# Patient Record
Sex: Female | Born: 1965 | Race: White | Hispanic: No | Marital: Married | Smoking: Former smoker
Health system: Southern US, Community
[De-identification: ages and names within clinical notes are randomized; demographics above are authoritative.]

## PROBLEM LIST (undated history)

## (undated) DIAGNOSIS — Z973 Presence of spectacles and contact lenses: Secondary | ICD-10-CM

## (undated) DIAGNOSIS — Z960 Presence of urogenital implants: Secondary | ICD-10-CM

## (undated) DIAGNOSIS — I059 Rheumatic mitral valve disease, unspecified: Secondary | ICD-10-CM

## (undated) DIAGNOSIS — D8989 Other specified disorders involving the immune mechanism, not elsewhere classified: Secondary | ICD-10-CM

## (undated) DIAGNOSIS — H3500 Unspecified background retinopathy: Secondary | ICD-10-CM

## (undated) DIAGNOSIS — R42 Dizziness and giddiness: Secondary | ICD-10-CM

## (undated) DIAGNOSIS — N879 Dysplasia of cervix uteri, unspecified: Secondary | ICD-10-CM

## (undated) DIAGNOSIS — M722 Plantar fascial fibromatosis: Secondary | ICD-10-CM

## (undated) HISTORY — DX: Plantar fascial fibromatosis: M72.2

## (undated) HISTORY — DX: Dysplasia of cervix uteri, unspecified: N87.9

## (undated) HISTORY — DX: Rheumatic mitral valve disease, unspecified: I05.9

## (undated) HISTORY — DX: Unspecified background retinopathy: H35.00

## (undated) HISTORY — PX: OTHER SURGICAL HISTORY: SHX169

## (undated) HISTORY — DX: Other specified disorders involving the immune mechanism, not elsewhere classified: D89.89

## (undated) HISTORY — PX: TONSILLECTOMY AND ADENOIDECTOMY: SUR1326

---

## 1998-04-23 DIAGNOSIS — N879 Dysplasia of cervix uteri, unspecified: Secondary | ICD-10-CM

## 1998-04-23 HISTORY — DX: Dysplasia of cervix uteri, unspecified: N87.9

## 1999-04-24 HISTORY — PX: OTHER SURGICAL HISTORY: SHX169

## 2003-04-24 HISTORY — PX: TUBAL LIGATION: SHX77

## 2006-06-14 ENCOUNTER — Ambulatory Visit: Payer: Self-pay | Admitting: Obstetrics and Gynecology

## 2006-10-04 ENCOUNTER — Ambulatory Visit: Payer: Self-pay

## 2007-08-24 ENCOUNTER — Emergency Department: Payer: Self-pay | Admitting: Emergency Medicine

## 2007-10-06 ENCOUNTER — Ambulatory Visit: Payer: Self-pay

## 2008-10-07 ENCOUNTER — Ambulatory Visit: Payer: Self-pay

## 2009-11-03 ENCOUNTER — Ambulatory Visit: Payer: Self-pay

## 2010-11-15 ENCOUNTER — Ambulatory Visit: Payer: Self-pay

## 2011-11-20 ENCOUNTER — Ambulatory Visit: Payer: Self-pay

## 2012-11-20 ENCOUNTER — Ambulatory Visit: Payer: Self-pay

## 2013-01-09 ENCOUNTER — Ambulatory Visit: Payer: Self-pay | Admitting: Internal Medicine

## 2013-11-25 ENCOUNTER — Ambulatory Visit: Payer: Self-pay

## 2014-04-02 ENCOUNTER — Ambulatory Visit: Payer: Self-pay | Admitting: Ophthalmology

## 2015-06-03 ENCOUNTER — Ambulatory Visit
Admission: EM | Admit: 2015-06-03 | Discharge: 2015-06-03 | Disposition: A | Discharge status: Home / Self Care | Payer: BC Managed Care – PPO | Attending: Family Medicine | Admitting: Family Medicine

## 2015-06-03 DIAGNOSIS — J029 Acute pharyngitis, unspecified: Secondary | ICD-10-CM | POA: Diagnosis not present

## 2015-06-03 DIAGNOSIS — K120 Recurrent oral aphthae: Secondary | ICD-10-CM

## 2015-06-03 LAB — RAPID STREP SCREEN (MED CTR MEBANE ONLY): Streptococcus, Group A Screen (Direct): NEGATIVE

## 2015-06-03 NOTE — ED Notes (Signed)
Patient c/o sore throat and what she calls the "start of an ear infection."  Sore throat started last Friday and ear discomfort started today.  Denies fever/c/n/v.

## 2015-06-03 NOTE — ED Provider Notes (Signed)
CSN: [id number]     Arrival date & time 06/03/15  1745 History   First MD Initiated Contact with Patient 06/03/15 1914     Chief Complaint  Patient presents with  . Sore Throat   (Consider location/radiation/quality/duration/timing/severity/associated sxs/prior Treatment) Patient is a 50 y.o. female presenting with pharyngitis. The history is provided by the patient.  Sore Throat This is a new problem. The current episode started more than 2 days ago. The problem occurs constantly. The problem has not changed since onset.Pertinent negatives include no shortness of breath.    History reviewed. No pertinent past medical history. Past Surgical History  Procedure Laterality Date  . Tubal ligation    . Tonsillectomy    . Addenoids removed    . Tubes in ears     History reviewed. No pertinent family history. Social History  Substance Use Topics  . Smoking status: Never Smoker   . Smokeless tobacco: Never Used  . Alcohol Use: No   OB History    No data available     Review of Systems  Respiratory: Negative for shortness of breath.     Allergies  Amoxicillin; Etodolac; and Sulfa antibiotics  Home Medications   Prior to Admission medications   Medication Sig Start Date End Date Taking? Authorizing Provider  Calcium Acetate, Phos Binder, (CALCIUM ACETATE PO) Take by mouth.   Yes Historical Provider, MD  GARLIC PO Take by mouth.   Yes Historical Provider, MD  Multiple Vitamin (MULTIVITAMIN) capsule Take 1 capsule by mouth daily.   Yes Historical Provider, MD  Omega-3 Fatty Acids (FISH OIL PO) Take 1 tablet by mouth daily.   Yes Historical Provider, MD   Meds Ordered and Administered this Visit  Medications - No data to display  BP 114/73 mmHg  Pulse 64  Temp(Src) 98.6 F (37 C) (Oral)  Resp 16  Ht  (1.6 m)  Wt 130 lb (58.968 kg)  BMI 23.03 kg/m2  SpO2 100%  LMP 05/20/2015 No data found.   Physical Exam  Constitutional: She appears well-developed and  well-nourished. No distress.  HENT:  Head: Normocephalic and atraumatic.  Right Ear: Tympanic membrane, external ear and ear canal normal.  Left Ear: Tympanic membrane, external ear and ear canal normal.  Nose: Mucosal edema and rhinorrhea present. No nose lacerations, sinus tenderness, nasal deformity, septal deviation or nasal septal hematoma. No epistaxis.  No foreign bodies. Right sinus exhibits no maxillary sinus tenderness and no frontal sinus tenderness. Left sinus exhibits no maxillary sinus tenderness and no frontal sinus tenderness.  Mouth/Throat: Uvula is midline, oropharynx is clear and moist and mucous membranes are normal. Oral lesions (left soft palate superficial apthous ulcer) present. No oropharyngeal exudate.    Eyes: Conjunctivae and EOM are normal. Pupils are equal, round, and reactive to light. Right eye exhibits no discharge. Left eye exhibits no discharge. No scleral icterus.  Neck: Normal range of motion. Neck supple. No thyromegaly present.  Cardiovascular: Normal rate, regular rhythm and normal heart sounds.   Pulmonary/Chest: Effort normal and breath sounds normal. No respiratory distress. She has no wheezes. She has no rales.  Lymphadenopathy:    She has no cervical adenopathy.  Skin: She is not diaphoretic.  Nursing note and vitals reviewed.   ED Course  Procedures (including critical care time)  Labs Review Labs Reviewed  RAPID STREP SCREEN (NOT AT [facility])  CULTURE, GROUP A STREP [facility])    Imaging Review No results found.   Visual Acuity Review  Right  Eye Distance:   Left Eye Distance:   Bilateral Distance:    Right Eye Near:   Left Eye Near:    Bilateral Near:         MDM   1. Viral pharyngitis   2. Aphthous ulcer     1. Lab result (negative strep) and diagnosis reviewed with patient 2. rx as per orders above; reviewed possible side effects, interactions, risks and benefits  3. Recommend supportive treatment with otc analgesics,  increased fluids, salt water gargles 4. Follow-up prn if symptoms worsen or don't improve   Payton Mccallum, MD 06/03/15 [id number]

## 2015-06-06 LAB — CULTURE, GROUP A STREP (THRC)

## 2015-11-28 ENCOUNTER — Other Ambulatory Visit: Payer: Self-pay | Admitting: Certified Nurse Midwife

## 2015-11-28 DIAGNOSIS — Z1231 Encounter for screening mammogram for malignant neoplasm of breast: Secondary | ICD-10-CM

## 2015-12-12 ENCOUNTER — Other Ambulatory Visit: Payer: Self-pay | Admitting: Certified Nurse Midwife

## 2015-12-12 ENCOUNTER — Ambulatory Visit
Admission: RE | Admit: 2015-12-12 | Discharge: 2015-12-12 | Disposition: A | Discharge status: Home / Self Care | Payer: BC Managed Care – PPO | Source: Ambulatory Visit | Attending: Certified Nurse Midwife | Admitting: Certified Nurse Midwife

## 2015-12-12 DIAGNOSIS — Z1231 Encounter for screening mammogram for malignant neoplasm of breast: Secondary | ICD-10-CM | POA: Insufficient documentation

## 2016-12-10 NOTE — Progress Notes (Signed)
Gynecology Annual Exam  PCP: Farrel Conners, CNM  Chief Complaint:  Chief Complaint  Patient presents with  . Gynecologic Exam    History of Present Illness:Robin Wise presents today for her annual exam.Sheis a 51 year old Caucasian/White female , G 2 P 2 0 0 2 ,whose LMP was 11/30/2016. She was beginning to have irregular menses 2 years ago and in the last 8 months has had 2 menses ( in February and in August). The menses lasted 5-7 days, medium flow, accompanied by some back pain.  Had frequent hot flashes from December to June, none since.   The patient's past medical history is notable for a history of a LEETZ procedure in Jan 2001, and has had normal Pap smears since then. 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 Since her last annual GYN exam dated 11/25/2015, she has stared seeing a new [facility] opthalmologist, Dr Rico Junker, who has been doing more testing and thinks patient may have an inflamed optic nerve, which may be treated with steroids. She is sexually active and has had a BTL 06/14/2006.   Her most recent pap smear was obtained 11/25/2015 and was NIL/neg HRHPV Her most recent mammogram obtained on 12/12/2015 was normal and revealed no significant changes. There is no family history of breast cancer. There is no family history of ovarian cancer. The patient does do occ self breast exams.  She has not had a colonoscopy and is eligible A DEXA scan is not applicable for this patient.  The patient does not smoke.  The patient rare drinks alcohol. The patient does not use illegal drugs.  The patient exercises regularly.  The patient does get adequate calcium in her diet.  She has not had a recent cholesterol screen  The patient denies current symptoms of depression.    Review of Systems: Review of Systems  Constitutional: Negative for chills, fever and weight loss.  HENT: Negative for congestion, sinus pain and sore throat.   Eyes: Negative  for blurred vision and pain.  Respiratory: Negative for hemoptysis, shortness of breath and wheezing.   Cardiovascular: Negative for chest pain, palpitations and leg swelling.  Gastrointestinal: Negative for abdominal pain, blood in stool, diarrhea, heartburn, nausea and vomiting.  Genitourinary: Negative for dysuria, frequency, hematuria and urgency.  Musculoskeletal: Negative for back pain, joint pain and myalgias.  Skin: Negative for itching and rash.  Neurological: Negative for dizziness, tingling and headaches.  Endo/Heme/Allergies: Negative for environmental allergies and polydipsia. Does not bruise/bleed easily.       Negative for hirsutism   Psychiatric/Behavioral: Negative for depression. The patient is not nervous/anxious and does not have insomnia.     Past Medical History:  Past Medical History:  Diagnosis Date  . Autoimmune retinopathy (HCC)   . Cervical dysplasia 2000  . Mitral valve disorder   . Plantar fasciitis     Past Surgical History:  Past Surgical History:  Procedure Laterality Date  . LEETZ  2001  . TONSILLECTOMY AND ADENOIDECTOMY    . TUBAL LIGATION    . Tubes in Ears      Family History:  Family History  Problem Relation Age of Onset  . Hypertension Father   . Stroke Father   . Arthritis/Rheumatoid Cousin   . Breast cancer Neg Hx     Social History:  Social History   Social History  . Marital status: Married    Spouse name: N/A  . Number of children: 2  .  Years of education: N/A   Occupational History  . Not on file.   Social History Main Topics  . Smoking status: Former Smoker    Years: 1.00  . Smokeless tobacco: Never Used  . Alcohol use Yes     Comment: rare  . Drug use: No  . Sexual activity: Yes    Partners: Male    Birth control/ protection: Surgical     Comment: BTL   Other Topics Concern  . Not on file   Social History Narrative  . No narrative on file    Allergies:  Allergies  Allergen Reactions  .  Amoxicillin Rash, Other (See Comments) and Hives    Calves got tight, joints got loose, and then got hives Loose joints  . Etodolac Hives  . Sulfa Antibiotics Hives    Medications: Prior to Admission medications   Medication Sig Start Date End Date Taking? Authorizing Provider  Calcium Acetate, Phos Binder, (CALCIUM ACETATE PO) Take by mouth.    [provider]  GARLIC PO Take by mouth.    [provider]  Multiple Vitamin (MULTIVITAMIN) capsule Take 1 capsule by mouth daily.    [provider]  Omega-3 Fatty Acids (FISH OIL PO) Take 1 tablet by mouth daily.    [provider]    Physical Exam Vitals: BP 108/62   Pulse 83   Ht 5\' 3"  (1.6 m)   Wt 133 lb (60.3 kg)   LMP 11/30/2016 (Exact Date)   BMI 23.56 kg/m   General: WF in NAD HEENT: normocephalic, anicteric Neck: no thyroid enlargement, no palpable nodules, no cervical lymphadenopathy  Pulmonary: No increased work of breathing, CTAB Cardiovascular: RRR, without murmur  Breast: Breast symmetrical, no tenderness, no palpable nodules or masses, no skin or nipple retraction present, no nipple discharge.  No axillary, infraclavicular or supraclavicular lymphadenopathy. Abdomen: Soft, non-tender, non-distended.  Umbilicus without lesions.  No hepatomegaly or masses palpable. No evidence of hernia. Genitourinary:  External: Normal external female genitalia.  Normal urethral meatus, normal Bartholin's and Skene's glands.    Vagina: Normal vaginal mucosa, no evidence of prolapse.    Cervix: s/p LEETZ, slightly friable with Pap, non-tender  Uterus: Anteverted, normal size, shape, and consistency, mobile, and non-tender  Adnexa: No adnexal masses, non-tender  Rectal: no masses, hemoccult negative  Lymphatic: no evidence of inguinal lymphadenopathy Extremities: no edema, erythema, or tenderness Neurologic: Grossly intact Psychiatric: mood appropriate, affect full     Assessment: 51 y.o. annual  well woman exam Perimenopausal bleeding  Plan:  1) Breast cancer screening - recommend monthly self breast exam and annual mammogram. Mammogram was ordered today.  2) Colon cancer screening: discussed options and would like to do hemoccult on stool is year. Considering colonoscopy. Hemoccult today was negative  3) Cervical cancer screening - Pap was done.  Patient opts for yearly screening interval  4) Contraception - BTL  5) Routine healthcare maintenance including cholesterol and diabetes screening ordered today. CBC, TSH also ordered. Will notify of results.  Farrel Conners, CNM

## 2016-12-11 ENCOUNTER — Encounter: Payer: Self-pay | Admitting: Certified Nurse Midwife

## 2016-12-11 ENCOUNTER — Ambulatory Visit: Payer: Self-pay | Admitting: Certified Nurse Midwife

## 2016-12-11 ENCOUNTER — Ambulatory Visit (INDEPENDENT_AMBULATORY_CARE_PROVIDER_SITE_OTHER): Payer: BC Managed Care – PPO | Admitting: Certified Nurse Midwife

## 2016-12-11 VITALS — BP 108/62 | HR 83 | Ht 63.0 in | Wt 133.0 lb

## 2016-12-11 DIAGNOSIS — H3500 Unspecified background retinopathy: Secondary | ICD-10-CM | POA: Diagnosis not present

## 2016-12-11 DIAGNOSIS — Z1239 Encounter for other screening for malignant neoplasm of breast: Secondary | ICD-10-CM

## 2016-12-11 DIAGNOSIS — Z01419 Encounter for gynecological examination (general) (routine) without abnormal findings: Secondary | ICD-10-CM | POA: Diagnosis not present

## 2016-12-11 DIAGNOSIS — D8989 Other specified disorders involving the immune mechanism, not elsewhere classified: Secondary | ICD-10-CM

## 2016-12-11 DIAGNOSIS — Z131 Encounter for screening for diabetes mellitus: Secondary | ICD-10-CM | POA: Diagnosis not present

## 2016-12-11 DIAGNOSIS — Z13220 Encounter for screening for lipoid disorders: Secondary | ICD-10-CM

## 2016-12-11 DIAGNOSIS — Z1329 Encounter for screening for other suspected endocrine disorder: Secondary | ICD-10-CM

## 2016-12-11 DIAGNOSIS — Z124 Encounter for screening for malignant neoplasm of cervix: Secondary | ICD-10-CM

## 2016-12-11 DIAGNOSIS — Z1231 Encounter for screening mammogram for malignant neoplasm of breast: Secondary | ICD-10-CM

## 2016-12-11 DIAGNOSIS — Z130 Encounter for screening for diseases of the blood and blood-forming organs and certain disorders involving the immune mechanism: Secondary | ICD-10-CM

## 2016-12-11 DIAGNOSIS — Z1211 Encounter for screening for malignant neoplasm of colon: Secondary | ICD-10-CM

## 2016-12-11 DIAGNOSIS — Z87410 Personal history of cervical dysplasia: Secondary | ICD-10-CM | POA: Insufficient documentation

## 2016-12-11 LAB — HEMOCCULT GUIAC POC 1CARD (OFFICE): Fecal Occult Blood, POC: NEGATIVE

## 2016-12-12 LAB — HEMOGLOBIN A1C
ESTIMATED AVERAGE GLUCOSE: 100 mg/dL
HEMOGLOBIN A1C: 5.1 % (ref 4.8–5.6)

## 2016-12-12 LAB — CBC WITH DIFFERENTIAL/PLATELET
BASOS ABS: 0 10*3/uL (ref 0.0–0.2)
Basos: 0 %
EOS (ABSOLUTE): 0.1 10*3/uL (ref 0.0–0.4)
Eos: 2 %
Hematocrit: 39.7 % (ref 34.0–46.6)
Hemoglobin: 12.8 g/dL (ref 11.1–15.9)
Immature Grans (Abs): 0 10*3/uL (ref 0.0–0.1)
Immature Granulocytes: 0 %
LYMPHS: 33 %
Lymphocytes Absolute: 1.9 10*3/uL (ref 0.7–3.1)
MCH: 28.9 pg (ref 26.6–33.0)
MCHC: 32.2 g/dL (ref 31.5–35.7)
MCV: 90 fL (ref 79–97)
Monocytes Absolute: 0.5 10*3/uL (ref 0.1–0.9)
Monocytes: 9 %
NEUTROS ABS: 3.3 10*3/uL (ref 1.4–7.0)
Neutrophils: 56 %
Platelets: 244 10*3/uL (ref 150–379)
RBC: 4.43 x10E6/uL (ref 3.77–5.28)
RDW: 13.6 % (ref 12.3–15.4)
WBC: 5.9 10*3/uL (ref 3.4–10.8)

## 2016-12-12 LAB — LIPID PANEL
Chol/HDL Ratio: 3.9 ratio (ref 0.0–4.4)
Cholesterol, Total: 213 mg/dL — ABNORMAL HIGH (ref 100–199)
HDL: 55 mg/dL (ref >39)
LDL Calculated: 135 mg/dL — ABNORMAL HIGH (ref 0–99)
Triglycerides: 117 mg/dL (ref 0–149)
VLDL Cholesterol Cal: 23 mg/dL (ref 5–40)

## 2016-12-12 LAB — TSH: TSH: 1.98 u[IU]/mL (ref 0.450–4.500)

## 2016-12-14 ENCOUNTER — Encounter: Payer: Self-pay | Admitting: Certified Nurse Midwife

## 2016-12-15 LAB — IGP,RFX APTIMA HPV ALL PTH: PAP SMEAR COMMENT: 0

## 2017-01-13 ENCOUNTER — Ambulatory Visit
Admission: EM | Admit: 2017-01-13 | Discharge: 2017-01-13 | Disposition: A | Discharge status: Home / Self Care | Payer: BC Managed Care – PPO | Attending: Emergency Medicine | Admitting: Emergency Medicine

## 2017-01-13 ENCOUNTER — Encounter: Payer: Self-pay | Admitting: Gynecology

## 2017-01-13 DIAGNOSIS — S0501XA Injury of conjunctiva and corneal abrasion without foreign body, right eye, initial encounter: Secondary | ICD-10-CM

## 2017-01-13 MED ORDER — MOXIFLOXACIN HCL 0.5 % OP SOLN: 1.0000 [drp] | Freq: Three times a day (TID) | OPHTHALMIC | 0 refills | Status: DC

## 2017-01-13 NOTE — ED Triage Notes (Signed)
Patient c/o right eye swollen and irritated x last pm.

## 2017-01-13 NOTE — ED Provider Notes (Signed)
[facility]    CSN: [id number] Arrival date & time: 01/13/17  1550     History   Chief Complaint Chief Complaint  Patient presents with  . Eye Problem    HPI Robin Wise is a 51 y.o. female.   HPI  This a 51 year old female who presents with right eye swelling and irritation that started yesterday. Patient does wear contacts routinely. 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 Later that night the eye began to swell and became more irritated. He does not have a conjunctivitis. He states that she has not had any visual disturbances in her visual acuity is normal today. She states that the discomfort in her eye is less today but she still was concerned that she may have a "infection". She has no photosensitivity. She states that on occasion she will feel a grittiness in her eye. She has not been using her contacts since last night.         Past Medical History:  Diagnosis Date  . Autoimmune retinopathy (HCC)   . Cervical dysplasia 2000  . Mitral valve disorder   . Plantar fasciitis     Patient Active Problem List   Diagnosis Date Noted  . History of cervical dysplasia 12/11/2016  . Autoimmune retinopathy (HCC)     Past Surgical History:  Procedure Laterality Date  . LEETZ  2001  . TONSILLECTOMY AND ADENOIDECTOMY    . TUBAL LIGATION    . Tubes in Ears      OB History    Gravida Para Term Preterm AB Living   SAB TAB Ectopic Multiple Live Births           2       Home Medications    Prior to Admission medications   Medication Sig Start Date End Date Taking? Authorizing Provider  Calcium Carbonate-Vitamin D (CALTRATE 600+D) 600-400 MG-UNIT tablet Take by mouth.   Yes [provider]  Garlic 1000 MG CAPS Take by mouth.   Yes [provider]  ibuprofen (ADVIL,MOTRIN) 600 MG tablet TAKE 1 TABLET 3 TIMES A DAY AS NEEDED. 12/02/13  Yes [provider]  Multiple Vitamin (MULTIVITAMIN) capsule Take 1 capsule by mouth daily.   Yes [provider]  Omega-3 Fatty Acids (FISH OIL PO) Take 1 tablet by mouth daily.   Yes [provider]  moxifloxacin (VIGAMOX) 0.5 % ophthalmic solution Place 1 drop into the right eye 3 (three) times daily. 01/13/17   Lutricia Feil, PA-C    Family History Family History  Problem Relation Age of Onset  . Hypertension Father   . Stroke Father   . Arthritis/Rheumatoid Cousin   . Breast cancer Neg Hx     Social History Social History  Substance Use Topics  . Smoking status: Former Smoker    Years: 1.00  . Smokeless tobacco: Never Used  . Alcohol use Yes     Comment: rare     Allergies   Amoxicillin; Etodolac; and Sulfa antibiotics   Review of Systems Review of Systems  Constitutional: Positive for activity change. Negative for appetite change, chills, fatigue and fever.  Eyes: Positive for pain. Negative for photophobia, discharge, redness, itching and visual disturbance.  All other systems reviewed and are negative.    Physical Exam Triage Vital Signs ED Triage Vitals  Enc Vitals Group  BP 01/13/17 1607 113/74     Pulse Rate 01/13/17 1607 65     Resp 01/13/17 1607 16     Temp 01/13/17 1607 98.4 F (36.9 C)     Temp Source 01/13/17 1607 Oral     SpO2 01/13/17 1607 100 %     Weight 01/13/17 1605 130 lb (59 kg)     Height 01/13/17 1605  (1.6 m)     Head Circumference --      Peak Flow --      Pain Score 01/13/17 1605 0     Pain Loc --      Pain Edu? --      Excl. in GC? --    No data found.   Updated Vital Signs BP 113/74 (BP Location: Left Arm)   Pulse 65   Temp 98.4 F (36.9 C) (Oral)   Resp 16   Ht  (1.6 m)   Wt 130 lb (59 kg)   LMP 06/15/2016   SpO2 100%   BMI 23.03 kg/m   Visual Acuity Right Eye Distance: 20/40 Left Eye Distance: 20/25 (with corrective lens) Bilateral Distance:    Right Eye Near:   Left Eye Near:     Bilateral Near:  20/25 (with corrective lens)  Physical Exam  Constitutional: She is oriented to person, place, and time. She appears well-developed and well-nourished. No distress.  HENT:  Head: Normocephalic.  Eyes: Pupils are equal, round, and reactive to light. Conjunctivae and EOM are normal. Right eye exhibits no discharge. Left eye exhibits no discharge.  Examination of the right eye shows PERRLA EOMs are intact. Visual acuity right 20/40 left 20/30 both 20/25. Conjunctiva appears clear. Is no discharge from the eyes present time. There is been no matting. Explained procedure to the patient the right eye was anesthetized with tetracaine. After adequate anesthesia the eyelid was everted with findings of no foreign objects under the lid. Fluorescein stain was then placed in the eye and under Woods lamp magnification the eye was examined. Is a small abrasion at the 6:00 position of her cornea. No other abnormalities were observed. Patient tolerated the procedure well.  Musculoskeletal: Normal range of motion.  Neurological: She is alert and oriented to person, place, and time.  Skin: Skin is warm and dry. She is not diaphoretic.  Psychiatric: She has a normal mood and affect. Her behavior is normal. Judgment and thought content normal.  Nursing note and vitals reviewed.    UC Treatments / Results  Labs (all labs ordered are listed, but only abnormal results are displayed) Labs Reviewed - No data to display  EKG  EKG Interpretation None       Radiology No results found.  Procedures Procedures (including critical care time)  Medications Ordered in UC Medications - No data to display   Initial Impression / Assessment and Plan / UC Course  I have reviewed the triage vital signs and the nursing notes.  Pertinent labs & imaging results that were available during my care of the patient were reviewed by me and considered in my medical decision making (see chart for  details).     Plan: 1. Test/x-ray results and diagnosis reviewed with patient 2. rx as per orders; risks, benefits, potential side effects reviewed with patient 3. Recommend supportive treatment with cool compresses to the eye 10 minutes every 2 hours for 5 times daily for comfort. Use the eyedrops for 5-7 days. Not use contacts for the 7 days. If  you do not notice marked improvement in a day or to follow-up with [facility]. 4. F/u prn if symptoms worsen or don't improve   Final Clinical Impressions(s) / UC Diagnoses   Final diagnoses:  Abrasion of right cornea, initial encounter    New Prescriptions Discharge Medication List as of 01/13/2017  4:34 PM    START taking these medications   Details  moxifloxacin (VIGAMOX) 0.5 % ophthalmic solution Place 1 drop into the right eye 3 (three) times daily., Starting Sun 01/13/2017, Normal         Controlled Substance Prescriptions [place] Controlled Substance Registry consulted? Not Applicable   Lutricia Feil, PA-C 01/13/17 1705

## 2017-05-13 ENCOUNTER — Ambulatory Visit
Admission: RE | Admit: 2017-05-13 | Discharge: 2017-05-13 | Disposition: A | Discharge status: Home / Self Care | Payer: BC Managed Care – PPO | Source: Ambulatory Visit | Attending: Certified Nurse Midwife | Admitting: Certified Nurse Midwife

## 2017-05-13 ENCOUNTER — Encounter (INDEPENDENT_AMBULATORY_CARE_PROVIDER_SITE_OTHER): Payer: Self-pay

## 2017-05-13 ENCOUNTER — Other Ambulatory Visit: Payer: Self-pay | Admitting: Certified Nurse Midwife

## 2017-05-13 DIAGNOSIS — Z1239 Encounter for other screening for malignant neoplasm of breast: Secondary | ICD-10-CM

## 2017-05-13 DIAGNOSIS — Z1231 Encounter for screening mammogram for malignant neoplasm of breast: Secondary | ICD-10-CM | POA: Diagnosis present

## 2018-01-23 ENCOUNTER — Other Ambulatory Visit: Payer: Self-pay

## 2018-01-23 ENCOUNTER — Ambulatory Visit
Admission: EM | Admit: 2018-01-23 | Discharge: 2018-01-23 | Disposition: A | Discharge status: Home / Self Care | Payer: BC Managed Care – PPO | Attending: Family Medicine | Admitting: Family Medicine

## 2018-01-23 ENCOUNTER — Encounter: Payer: Self-pay | Admitting: Emergency Medicine

## 2018-01-23 DIAGNOSIS — H8110 Benign paroxysmal vertigo, unspecified ear: Secondary | ICD-10-CM | POA: Diagnosis not present

## 2018-01-23 MED ORDER — MECLIZINE HCL 25 MG PO TABS: 25.0000 mg | ORAL_TABLET | Freq: Three times a day (TID) | ORAL | 0 refills | Status: DC | PRN

## 2018-01-23 NOTE — ED Provider Notes (Signed)
[facility]    CSN: [id number] Arrival date & time: 01/23/18  1654  History   Chief Complaint Chief Complaint  Patient presents with  . Dizziness   HPI  52 year old female presents with dizziness.  Started abruptly this morning.  Patient feels that she has vertigo.  She states that she feels like things are spinning.  Worse with abrupt movements.  Improves when she is at rest.  Associated nausea.  She is had one episode of emesis.  No medications tried.  She has rested and stayed still.  This helps her symptoms.  No hearing loss.  No other associated symptoms.  No other complaints.  PMH, Surgical Hx, Family Hx, Social History reviewed and updated as below.  Past Medical History:  Diagnosis Date  . Autoimmune retinopathy (HCC)   . Cervical dysplasia 2000  . Mitral valve disorder   . Plantar fasciitis     Patient Active Problem List   Diagnosis Date Noted  . History of cervical dysplasia 12/11/2016  . Autoimmune retinopathy (HCC)     Past Surgical History:  Procedure Laterality Date  . LEETZ  2001  . TONSILLECTOMY AND ADENOIDECTOMY    . TUBAL LIGATION    . Tubes in Ears      OB History    Gravida  2   Para  2   Term  2   Preterm      AB      Living  2     SAB      TAB      Ectopic      Multiple      Live Births  2            Home Medications    Prior to Admission medications   Medication Sig Start Date End Date Taking? Authorizing Provider  Calcium Carbonate-Vitamin D (CALTRATE 600+D) 600-400 MG-UNIT tablet Take by mouth.   Yes [provider]  Garlic 1000 MG CAPS Take by mouth.   Yes [provider]  Multiple Vitamin (MULTIVITAMIN) capsule Take 1 capsule by mouth daily.   Yes [provider]  Omega-3 Fatty Acids (FISH OIL PO) Take 1 tablet by mouth daily.   Yes [provider]  ibuprofen (ADVIL,MOTRIN) 600 MG tablet TAKE 1 TABLET 3 TIMES A DAY AS NEEDED. 12/02/13   [provider]    meclizine (ANTIVERT) 25 MG tablet Take 1 tablet (25 mg total) by mouth 3 (three) times daily as needed for dizziness. 01/23/18   Tommie Sams, DO    Family History Family History  Problem Relation Age of Onset  . Hypertension Father   . Stroke Father   . Arthritis/Rheumatoid Cousin   . Breast cancer Neg Hx     Social History Social History   Tobacco Use  . Smoking status: Former Smoker    Years: 1.00  . Smokeless tobacco: Never Used  Substance Use Topics  . Alcohol use: Yes    Comment: rare  . Drug use: No     Allergies   Amoxicillin; Etodolac; and Sulfa antibiotics   Review of Systems Review of Systems  Gastrointestinal: Positive for nausea and vomiting.  Neurological: Positive for dizziness.   Physical Exam Triage Vital Signs ED Triage Vitals  Enc Vitals Group     BP 01/23/18 1725 124/79     Pulse Rate 01/23/18 1725 74     Resp 01/23/18 1725 14     Temp 01/23/18 1725 98.3 F (36.8 C)  Temp Source 01/23/18 1725 Oral     SpO2 01/23/18 1725 98 %     Weight 01/23/18 1722 130 lb (59 kg)     Height 01/23/18 1722 5\' 3"  (1.6 m)     Head Circumference --      Peak Flow --      Pain Score 01/23/18 1721 0     Pain Loc --      Pain Edu? --      Excl. in GC? --    Updated Vital Signs BP 124/79 (BP Location: Left Arm)   Pulse 74   Temp 98.3 F (36.8 C) (Oral)   Resp 14   Ht 5\' 3"  (1.6 m)   Wt 59 kg   SpO2 98%   BMI 23.03 kg/m   Visual Acuity Right Eye Distance:   Left Eye Distance:   Bilateral Distance:    Right Eye Near:   Left Eye Near:    Bilateral Near:     Physical Exam  Constitutional: She is oriented to person, place, and time. She appears well-developed. No distress.  HENT:  Head: Normocephalic and atraumatic.  Eyes: Conjunctivae are normal. Right eye exhibits no discharge. Left eye exhibits no discharge.  Cardiovascular: Normal rate and regular rhythm.  Pulmonary/Chest: Effort normal and breath sounds normal.  Neurological: She is  alert and oriented to person, place, and time.  Psychiatric: She has a normal mood and affect. Her behavior is normal.  Nursing note and vitals reviewed.  UC Treatments / Results  Labs (all labs ordered are listed, but only abnormal results are displayed) Labs Reviewed - No data to display  EKG None  Radiology No results found.  Procedures Procedures (including critical care time)  Medications Ordered in UC Medications - No data to display  Initial Impression / Assessment and Plan / UC Course  I have reviewed the triage vital signs and the nursing notes.  Pertinent labs & imaging results that were available during my care of the patient were reviewed by me and considered in my medical decision making (see chart for details).    52 year old female presents with BPPV.  Treating with meclizine.  Advised Epley maneuver.  Prescription given for vestibular rehab if she fails to improve or worsens.  Final Clinical Impressions(s) / UC Diagnoses   Final diagnoses:  Benign paroxysmal positional vertigo, unspecified laterality   Discharge Instructions   None    ED Prescriptions    Medication Sig Dispense Auth. Provider   meclizine (ANTIVERT) 25 MG tablet Take 1 tablet (25 mg total) by mouth 3 (three) times daily as needed for dizziness. 30 tablet Tommie Sams, DO     Controlled Substance Prescriptions [place] Controlled Substance Registry consulted? Not Applicable   Tommie Sams, DO 01/23/18 2105

## 2018-01-23 NOTE — ED Triage Notes (Signed)
Patient c/o dizziness and felt like the room was spinning that started this morning. Patient states that her dizziness is worse when she moves a certain way.  Patient reports vomiting once.

## 2018-04-13 NOTE — Progress Notes (Signed)
Gynecology Annual Exam  PCP: Farrel ConnersGutierrez, Tymothy Cass, CNM  Chief Complaint:  Chief Complaint  Patient presents with  . Gynecologic Exam    History of Present Illness:Robin Wise presents today for her annual exam.She is a 52 year old Caucasian/White female , G 2 P 2 0 0 2 ,whose LMP was 04/08/2018. She was beginning to have irregular menses 3 years ago. IN 2018 she had 3 menses, the last one in October 2018. She had one menses this year on 04/08/2018 which is still going one. The first 3-4 days, she changed pads every 4 hours. She had one day of cramping.  Her hot flashes have increased over the last year. She had premenstrual headache and breast tenderness.   The patient's past medical history is notable for a history of a LEETZ procedure in Jan 2001, and has had normal Pap smears since then. 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 Since her last annual GYN exam dated 12/11/2016, her [facility] opthalmologist, Dr Rico JunkerIacconi, has been injecting her eyes with Kenalog every 3 months which has been helping her vision. She is asking about the genetic test for retinitis pigmentosa. She is sexually active and has had a BTL 06/14/2006.   Her most recent pap smear was obtained 12/11/2016 and was NIL. Her most recent mammogram obtained on 05/14/2017 was normal and revealed no significant changes. There is no family history of breast cancer. There is no family history of ovarian cancer. The patient does do self breast exams.  She has not had a colonoscopy and is eligible A DEXA scan is not applicable for this patient.  The patient does not smoke.  The patient rarely drinks alcohol. The patient does not use illegal drugs.  The patient exercises regularly.  The patient does get adequate calcium in her diet.  She had a cholesterol screen 2018 which was borderline elevated  The patient denies current symptoms of depression.    Review of Systems: Review of Systems    Constitutional: Negative for chills, fever and weight loss.  HENT: Negative for congestion, sinus pain and sore throat.   Eyes: Positive for blurred vision. Negative for pain.  Respiratory: Negative for hemoptysis, shortness of breath and wheezing.   Cardiovascular: Negative for chest pain, palpitations and leg swelling.  Gastrointestinal: Positive for constipation. Negative for abdominal pain, blood in stool, diarrhea, heartburn, nausea and vomiting.  Genitourinary: Negative for dysuria, frequency, hematuria and urgency.  Musculoskeletal: Negative for back pain, joint pain and myalgias.  Skin: Negative for itching and rash.  Neurological: Positive for dizziness. Negative for tingling and headaches.  Endo/Heme/Allergies: Negative for environmental allergies and polydipsia. Does not bruise/bleed easily.       Positive hot flashes   Psychiatric/Behavioral: Negative for depression. The patient is not nervous/anxious and does not have insomnia.   Breasts: positive for breast tenderness  Past Medical History:  Past Medical History:  Diagnosis Date  . Autoimmune retinopathy (HCC)   . Cervical dysplasia 2000  . Mitral valve disorder   . Plantar fasciitis     Past Surgical History:  Past Surgical History:  Procedure Laterality Date  . LEETZ  2001  . TONSILLECTOMY AND ADENOIDECTOMY    . TUBAL LIGATION  2005  . Tubes in Ears      Family History:  Family History  Problem Relation Age of Onset  . Hypertension Father   . Stroke Father   . Arthritis/Rheumatoid Cousin   . Breast cancer  Neg Hx     Social History:  Social History   Socioeconomic History  . Marital status: Married    Spouse name: Not on file  . Number of children: 2  . Years of education: Not on file  . Highest education level: Not on file  Occupational History  . Not on file  Social Needs  . Financial resource strain: Not on file  . Food insecurity:    Worry: Not on file    Inability: Not on file  .  Transportation needs:    Medical: Not on file    Non-medical: Not on file  Tobacco Use  . Smoking status: Former Smoker    Years: 1.00  . Smokeless tobacco: Never Used  Substance and Sexual Activity  . Alcohol use: Yes    Comment: rare  . Drug use: No  . Sexual activity: Yes    Partners: Male    Birth control/protection: Surgical    Comment: BTL  Lifestyle  . Physical activity:    Days per week: Not on file    Minutes per session: Not on file  . Stress: Not on file  Relationships  . Social connections:    Talks on phone: Not on file    Gets together: Not on file    Attends religious service: Not on file    Active member of club or organization: Not on file    Attends meetings of clubs or organizations: Not on file    Relationship status: Not on file  . Intimate partner violence:    Fear of current or ex partner: Not on file    Emotionally abused: Not on file    Physically abused: Not on file    Forced sexual activity: Not on file  Other Topics Concern  . Not on file  Social History Narrative  . Not on file    Allergies:  Allergies  Allergen Reactions  . Amoxicillin Rash, Other (See Comments) and Hives    Calves got tight, joints got loose, and then got hives Loose joints  . Etodolac Hives  . Sulfa Antibiotics Hives    Medications: Prior to Admission medications   Medication Sig Start Date End Date Taking? Authorizing Provider  Calcium Acetate, Phos Binder, (CALCIUM ACETATE PO) Take by mouth.    [provider]  GARLIC PO Take by mouth.    [provider]  Multiple Vitamin (MULTIVITAMIN) capsule Take 1 capsule by mouth daily.    [provider]  Omega-3 Fatty Acids (FISH OIL PO) Take 1 tablet by mouth daily.    [provider]  Kenalog injections every 3 months  Physical Exam Vitals: BP 110/62   Pulse 85   Ht 5\' 3"  (1.6 m)   Wt 134 lb (60.8 kg)   LMP 04/08/2018 (Exact Date)   BMI 23.74 kg/m   General: WF in  NAD HEENT: normocephalic, anicteric Neck: no thyroid enlargement, no palpable nodules, no cervical lymphadenopathy  Pulmonary: No increased work of breathing, CTAB Cardiovascular: RRR, without murmur  Breast: Breast symmetrical, no tenderness, no palpable nodules or masses, no skin or nipple retraction present, no nipple discharge.  No axillary, infraclavicular or supraclavicular lymphadenopathy. Abdomen: Soft, non-tender, non-distended.  Umbilicus without lesions.  No hepatomegaly or masses palpable. No evidence of hernia. Genitourinary:  External: Normal external female genitalia.  Normal urethral meatus, normal Bartholin's and Skene's glands.    Vagina: Normal vaginal mucosa, no evidence of prolapse, small amount of bleeding   Cervix: s/p  LEETZ, non-tender  Uterus: Anteverted, normal size, shape, and consistency, mobile, and non-tender  Adnexa: No adnexal masses, non-tender  Rectal: no masses, hemoccult negative  Lymphatic: no evidence of inguinal lymphadenopathy Extremities: no edema, erythema, or tenderness Neurologic: Grossly intact Psychiatric: mood appropriate, affect full     Assessment: 52 y.o. annual well woman exam Postmenopausal bleeding  Plan:  1) Breast cancer screening - recommend monthly self breast exam and annual mammogram. Mammogram was ordered today.  2) Colon cancer screening: discussed options and would like to do hemoccult on stool is year. Considering colonoscopy. Hemoccult today was negative  3) Cervical cancer screening - Pap was done.  Patient opts for yearly screening interval  4) Contraception - BTL  5) Repeat cholesterol screening ordered today while fasitng Will notify of results.  6) Genetic testing for retinitis pigmentosa Inher[facility]est # [id number] and #59 $1200-if desires   7) Recommend ultrasound and possible endometrial biopsy for further bleeding episodes.  Farrel Connersolleen Saad Buhl, CNM

## 2018-04-14 ENCOUNTER — Ambulatory Visit (INDEPENDENT_AMBULATORY_CARE_PROVIDER_SITE_OTHER): Payer: BC Managed Care – PPO | Admitting: Certified Nurse Midwife

## 2018-04-14 ENCOUNTER — Encounter: Payer: Self-pay | Admitting: Certified Nurse Midwife

## 2018-04-14 ENCOUNTER — Telehealth: Payer: Self-pay | Admitting: Certified Nurse Midwife

## 2018-04-14 ENCOUNTER — Other Ambulatory Visit (HOSPITAL_COMMUNITY)
Admission: RE | Admit: 2018-04-14 | Discharge: 2018-04-14 | Disposition: A | Discharge status: Home / Self Care | Payer: BC Managed Care – PPO | Source: Ambulatory Visit | Attending: Certified Nurse Midwife | Admitting: Certified Nurse Midwife

## 2018-04-14 VITALS — BP 110/62 | HR 85 | Ht 63.0 in | Wt 134.0 lb

## 2018-04-14 DIAGNOSIS — Z124 Encounter for screening for malignant neoplasm of cervix: Secondary | ICD-10-CM | POA: Insufficient documentation

## 2018-04-14 DIAGNOSIS — Z1239 Encounter for other screening for malignant neoplasm of breast: Secondary | ICD-10-CM

## 2018-04-14 DIAGNOSIS — Z1211 Encounter for screening for malignant neoplasm of colon: Secondary | ICD-10-CM | POA: Diagnosis not present

## 2018-04-14 DIAGNOSIS — Z13220 Encounter for screening for lipoid disorders: Secondary | ICD-10-CM

## 2018-04-14 DIAGNOSIS — Z01419 Encounter for gynecological examination (general) (routine) without abnormal findings: Secondary | ICD-10-CM | POA: Insufficient documentation

## 2018-04-14 NOTE — Telephone Encounter (Signed)
Patient is calling needing to schedule a lab appointment. Colleen, Please order labs so I can call patient to schedule for Fasting labs. Thank you!

## 2018-04-14 NOTE — Telephone Encounter (Signed)
Will put order in for lipid panel

## 2018-04-17 ENCOUNTER — Other Ambulatory Visit: Payer: BC Managed Care – PPO

## 2018-04-17 DIAGNOSIS — Z13220 Encounter for screening for lipoid disorders: Secondary | ICD-10-CM

## 2018-04-18 LAB — LIPID PANEL WITH LDL/HDL RATIO
CHOLESTEROL TOTAL: 238 mg/dL — AB (ref 100–199)
HDL: 59 mg/dL (ref >39)
LDL Calculated: 162 mg/dL — ABNORMAL HIGH (ref 0–99)
LDl/HDL Ratio: 2.7 ratio (ref 0.0–3.2)
Triglycerides: 83 mg/dL (ref 0–149)
VLDL CHOLESTEROL CAL: 17 mg/dL (ref 5–40)

## 2018-04-21 LAB — CYTOLOGY - PAP: DIAGNOSIS: NEGATIVE

## 2018-04-23 LAB — HEMOCCULT GUIAC POC 1CARD (OFFICE): FECAL OCCULT BLD: NEGATIVE

## 2018-04-29 ENCOUNTER — Encounter: Payer: Self-pay | Admitting: Certified Nurse Midwife

## 2019-04-20 ENCOUNTER — Ambulatory Visit (INDEPENDENT_AMBULATORY_CARE_PROVIDER_SITE_OTHER): Payer: BC Managed Care – PPO | Admitting: Certified Nurse Midwife

## 2019-04-20 ENCOUNTER — Encounter: Payer: Self-pay | Admitting: Certified Nurse Midwife

## 2019-04-20 ENCOUNTER — Other Ambulatory Visit: Payer: Self-pay

## 2019-04-20 VITALS — BP 130/80 | HR 72 | Ht 63.0 in | Wt 135.0 lb

## 2019-04-20 DIAGNOSIS — N951 Menopausal and female climacteric states: Secondary | ICD-10-CM

## 2019-04-20 DIAGNOSIS — Z01419 Encounter for gynecological examination (general) (routine) without abnormal findings: Secondary | ICD-10-CM | POA: Diagnosis not present

## 2019-04-20 DIAGNOSIS — Z1211 Encounter for screening for malignant neoplasm of colon: Secondary | ICD-10-CM | POA: Diagnosis not present

## 2019-04-20 DIAGNOSIS — E7800 Pure hypercholesterolemia, unspecified: Secondary | ICD-10-CM

## 2019-04-20 DIAGNOSIS — Z1239 Encounter for other screening for malignant neoplasm of breast: Secondary | ICD-10-CM

## 2019-04-20 NOTE — Progress Notes (Signed)
Gynecology Annual Exam  PCP: Farrel Conners, CNM  Chief Complaint:  Chief Complaint  Patient presents with  . Gynecologic Exam    sxs of menopause    History of Present Illness:Robin Wise presents today for her annual exam.She is a 53 year old Caucasian/White female , G 2 P 2 0 0 2 ,whose LMP was 04/08/2018. She was beginning to have irregular menses 3 years ago. In 2018 she had 3 menses and last year she had 2 menses.  She had one menses this year on 10/25/2018. This menses was light and lasted 4-5 days.   Her hot flashes have increased over the last 1-2 years   The patient's past medical history is notable for a history of a LEETZ procedure in Jan 2001, and has had normal Pap smears since then. 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 Since her last annual GYN exam dated 04/14/2018, her [facility] opthalmologist, Dr Rico Junker, has been injecting her eyes with Kenalog every 6 months which has been helping her vision. She had blood work drawn to see if she has a genetic mutation for retinitis pigmentosa but her opthalmologist is still checking to see if that test could be paid for by insurance before he submits the blood for testing.  She also reports that her 49 yr old husband had a MI and had stents placed. They are walking most days of the week and she s cooking low cholesterol low salt foods.  She is sexually active and has had a BTL 06/14/2006.   Her most recent pap smear was obtained 04/14/2018 and was NIL. Her most recent mammogram obtained on 05/14/2017 was normal and revealed no significant changes. There is no family history of breast cancer. There is no family history of ovarian cancer. The patient does do self breast exams.  She has not had a colonoscopy and is eligible A DEXA scan is not applicable for this patient.  The patient does not smoke.  The patient rarely drinks alcohol. The patient does not use illegal drugs.  The patient exercises  regularly by walking 30 minutes, 4x/week The patient does get adequate calcium in her diet.  She had a cholesterol screen 2019 which was elevated (TC238, LDL162, HDL59). She would like another lipid panel done.  The patient denies current symptoms of depression.    Review of Systems: Review of Systems  Constitutional: Negative for chills, fever and weight loss.  HENT: Negative for congestion, sinus pain and sore throat.   Eyes: Positive for blurred vision. Negative for pain.  Respiratory: Negative for hemoptysis, shortness of breath and wheezing.   Cardiovascular: Positive for palpitations (from MVP). Negative for chest pain and leg swelling.  Gastrointestinal: Negative for abdominal pain, blood in stool, constipation, diarrhea, heartburn, nausea and vomiting.  Genitourinary: Negative for dysuria, frequency, hematuria and urgency.       Positive for urinary incontinence.  Musculoskeletal: Negative for back pain, joint pain and myalgias.  Skin: Negative for itching and rash.  Neurological: Negative for dizziness, tingling and headaches.  Endo/Heme/Allergies: Negative for environmental allergies and polydipsia. Does not bruise/bleed easily.       Positive hot flashes   Psychiatric/Behavioral: Negative for depression. The patient is not nervous/anxious and does not have insomnia.   Breasts: no masses or tenderness  Past Medical History:  Past Medical History:  Diagnosis Date  . Autoimmune retinopathy (HCC)   . Cervical dysplasia 2000  . Mitral valve disorder   .  Plantar fasciitis     Past Surgical History:  Past Surgical History:  Procedure Laterality Date  . LEETZ  2001  . TONSILLECTOMY AND ADENOIDECTOMY    . TUBAL LIGATION  2005  . Tubes in Ears      Family History:  Family History  Problem Relation Age of Onset  . Hypertension Father   . Stroke Father   . Arthritis/Rheumatoid Cousin   . Breast cancer Neg Hx     Social History:  Social History   Socioeconomic  History  . Marital status: Married    Spouse name: Not on file  . Number of children: 2  . Years of education: Not on file  . Highest education level: Not on file  Occupational History  . Not on file  Tobacco Use  . Smoking status: Former Smoker    Years: 1.00  . Smokeless tobacco: Never Used  Substance and Sexual Activity  . Alcohol use: Yes    Comment: rare  . Drug use: No  . Sexual activity: Yes    Partners: Male    Birth control/protection: Surgical    Comment: BTL  Other Topics Concern  . Not on file  Social History Narrative  . Not on file   Social Determinants of Health   Financial Resource Strain:   . Difficulty of Paying Living Expenses: Not on file  Food Insecurity:   . Worried About Programme researcher, broadcasting/film/videounning Out of Food in the Last Year: Not on file  . Ran Out of Food in the Last Year: Not on file  Transportation Needs:   . Lack of Transportation (Medical): Not on file  . Lack of Transportation (Non-Medical): Not on file  Physical Activity:   . Days of Exercise per Week: Not on file  . Minutes of Exercise per Session: Not on file  Stress:   . Feeling of Stress : Not on file  Social Connections:   . Frequency of Communication with Friends and Family: Not on file  . Frequency of Social Gatherings with Friends and Family: Not on file  . Attends Religious Services: Not on file  . Active Member of Clubs or Organizations: Not on file  . Attends BankerClub or Organization Meetings: Not on file  . Marital Status: Not on file  Intimate Partner Violence:   . Fear of Current or Ex-Partner: Not on file  . Emotionally Abused: Not on file  . Physically Abused: Not on file  . Sexually Abused: Not on file    Allergies:  Allergies  Allergen Reactions  . Amoxicillin Rash, Other (See Comments) and Hives    Calves got tight, joints got loose, and then got hives Loose joints  . Ibuprofen Itching     possible allergy  . Etodolac Hives  . Sulfa Antibiotics Hives    Medications: Prior  to Admission medications   Medication Sig Start Date End Date Taking? Authorizing Provider  Calcium Acetate, Phos Binder, (CALCIUM ACETATE PO) Take by mouth.    [provider]  GARLIC PO Take by mouth.    [provider]  Multiple Vitamin (MULTIVITAMIN) capsule Take 1 capsule by mouth daily.    [provider]  Omega-3 Fatty Acids (FISH OIL PO) Take 1 tablet by mouth daily.    [provider]  Kenalog injections in eyes every 6 months  Physical Exam Vitals: BP 130/80   Pulse 72   Ht 5\' 3"  (1.6 m)   Wt 135 lb (61.2 kg)   BMI 23.91 kg/m  General: WF in NAD HEENT: normocephalic, anicteric Neck: no thyroid enlargement, no palpable nodules, no cervical lymphadenopathy  Pulmonary: No increased work of breathing, CTAB Cardiovascular: RRR, without murmur  Breast: Breast symmetrical, no tenderness, no palpable nodules or masses, no skin or nipple retraction present, no nipple discharge.  No axillary, infraclavicular or supraclavicular lymphadenopathy. Abdomen: Soft, non-tender, non-distended.  Umbilicus without lesions.  No hepatomegaly or masses palpable. No evidence of hernia. Genitourinary:  External: Normal external female genitalia.  Normal urethral meatus, normal Bartholin's and Skene's glands.    Vagina: Normal vaginal mucosa, mild urethrocele, no bleeding   Cervix: s/p LEETZ, non-tender  Uterus: Anteverted, normal size, shape, and consistency, mobile, and non-tender  Adnexa: No adnexal masses, non-tender  Rectal: no masses, hemoccult negative  Lymphatic: no evidence of inguinal lymphadenopathy Extremities: no edema, erythema, or tenderness Neurologic: Grossly intact Psychiatric: mood appropriate, affect full     Assessment: 53 y.o. annual well woman exam Postmenopausal bleeding-discussed further work up for PMB, i e. Pelvic ultrasound-she declines at this time  Will let me know if having further bleeding Menopausal symptoms: briefly  discussed treatments for hot flashes.   Plan:  1) Breast cancer screening - recommend monthly self breast exam and annual mammogram. Mammogram was ordered today.  2) Colon cancer screening: discussed options and would like to do hemoccult on stool is year. Hemoccult today was negative  3) Cervical cancer screening - Pap was not done.  Patient opts for every 3 year screening interval  4) Contraception - BTL  5) Repeat cholesterol screening ordered today while fasitng Will notify of results. Given literature on low cholesterol diet   6) Recommend ultrasound and possible endometrial biopsy for further bleeding episodes.  7) RTO 1 year for annual exam  Dalia Heading, CNM

## 2019-04-21 LAB — LIPID PANEL WITH LDL/HDL RATIO
Cholesterol, Total: 268 mg/dL — ABNORMAL HIGH (ref 100–199)
HDL: 67 mg/dL (ref >39)
LDL Chol Calc (NIH): 183 mg/dL — ABNORMAL HIGH (ref 0–99)
LDL/HDL Ratio: 2.7 ratio (ref 0.0–3.2)
Triglycerides: 106 mg/dL (ref 0–149)
VLDL Cholesterol Cal: 18 mg/dL (ref 5–40)

## 2019-04-26 LAB — HEMOCCULT GUIAC POC 1CARD (OFFICE): Fecal Occult Blood, POC: NEGATIVE

## 2019-05-13 ENCOUNTER — Telehealth: Payer: Self-pay | Admitting: Certified Nurse Midwife

## 2019-05-13 DIAGNOSIS — E7800 Pure hypercholesterolemia, unspecified: Secondary | ICD-10-CM

## 2019-05-13 NOTE — Telephone Encounter (Signed)
Patient is calling for lab results. Please advise.

## 2019-05-19 NOTE — Telephone Encounter (Signed)
Called patient with her lipid panel results. Total cholesterol elevated at 268, triglycerides were normal. HDL 67 and LDL 183. ASCVD 10 year risk score is 1.9%. Has been trying to decrease cholesterol in her diet. Discussed foods high in trans fats/ saturated fat. Has been exercising also. Recommend repeating lipid panel in the summer and also repeating her blood pressure at the pharmacy in the next month or two. Last one was 130/80. Questions answered.  Farrel Conners, CNM

## 2020-04-20 ENCOUNTER — Other Ambulatory Visit: Payer: Self-pay

## 2020-04-20 ENCOUNTER — Encounter: Payer: Self-pay | Admitting: Advanced Practice Midwife

## 2020-04-20 ENCOUNTER — Other Ambulatory Visit (HOSPITAL_COMMUNITY)
Admission: RE | Admit: 2020-04-20 | Discharge: 2020-04-20 | Disposition: A | Discharge status: Home / Self Care | Payer: BC Managed Care – PPO | Source: Ambulatory Visit | Attending: Advanced Practice Midwife | Admitting: Advanced Practice Midwife

## 2020-04-20 ENCOUNTER — Ambulatory Visit (INDEPENDENT_AMBULATORY_CARE_PROVIDER_SITE_OTHER): Payer: BC Managed Care – PPO | Admitting: Advanced Practice Midwife

## 2020-04-20 VITALS — BP 140/77 | Ht 63.0 in | Wt 134.4 lb

## 2020-04-20 DIAGNOSIS — Z01419 Encounter for gynecological examination (general) (routine) without abnormal findings: Secondary | ICD-10-CM | POA: Insufficient documentation

## 2020-04-20 DIAGNOSIS — Z8742 Personal history of other diseases of the female genital tract: Secondary | ICD-10-CM

## 2020-04-20 DIAGNOSIS — Z1211 Encounter for screening for malignant neoplasm of colon: Secondary | ICD-10-CM

## 2020-04-20 DIAGNOSIS — I341 Nonrheumatic mitral (valve) prolapse: Secondary | ICD-10-CM | POA: Insufficient documentation

## 2020-04-20 DIAGNOSIS — Z124 Encounter for screening for malignant neoplasm of cervix: Secondary | ICD-10-CM

## 2020-04-20 DIAGNOSIS — N898 Other specified noninflammatory disorders of vagina: Secondary | ICD-10-CM

## 2020-04-20 DIAGNOSIS — Z1239 Encounter for other screening for malignant neoplasm of breast: Secondary | ICD-10-CM | POA: Diagnosis not present

## 2020-04-20 DIAGNOSIS — Z13220 Encounter for screening for lipoid disorders: Secondary | ICD-10-CM

## 2020-04-20 DIAGNOSIS — Z0000 Encounter for general adult medical examination without abnormal findings: Secondary | ICD-10-CM

## 2020-04-20 NOTE — Progress Notes (Signed)
Annual Exam

## 2020-04-20 NOTE — Progress Notes (Signed)
Gynecology Annual Exam  PCP: Farrel Conners, CNM (Inactive)  Chief Complaint:  Chief Complaint  Patient presents with  . Gynecologic Exam    History of Present Illness:Patient is a 54 y.o. G2P2002 presents for annual exam. The patient has no complaints today.   LMP: No LMP recorded. Patient is postmenopausal  Postcoital Bleeding: no   The patient is sexually active. She denies dyspareunia.  The patient does perform self breast exams.  There is no notable family history of breast or ovarian cancer in her family.  The patient wears seatbelts: yes.   The patient has regular exercise: She has limited exercise in the past year due to Covid. Her job is primarily sedentary. She admits a healthy diet and adequate hydration and sleep.    The patient denies current symptoms of depression.  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  Review of Systems: Review of Systems  Constitutional: Negative for chills and fever.  HENT: Negative for congestion, ear discharge, ear pain, hearing loss, sinus pain and sore throat.   Eyes: Negative for blurred vision and double vision.  Respiratory: Negative for cough, shortness of breath and wheezing.   Cardiovascular: Negative for chest pain, palpitations and leg swelling.  Gastrointestinal: Negative for abdominal pain, blood in stool, constipation, diarrhea, heartburn, melena, nausea and vomiting.  Genitourinary: Negative for dysuria, flank pain, frequency, hematuria and urgency.  Musculoskeletal: Negative for back pain, joint pain and myalgias.  Skin: Negative for itching and rash.  Neurological: Negative for dizziness, tingling, tremors, sensory change, speech change, focal weakness, seizures, loss of consciousness, weakness and headaches.  Endo/Heme/Allergies: Negative for environmental allergies. Does not bruise/bleed easily.       Positive for vaginal dryness and hot flashes   Psychiatric/Behavioral: Negative for depression, hallucinations, memory loss, substance abuse and suicidal ideas. The patient is not nervous/anxious and does not have insomnia.     Past Medical History:  Patient Active Problem List   Diagnosis Date Noted  . Mitral valve prolapse 04/20/2020  . History of cervical dysplasia 12/11/2016    Had a LEETZ in 2001 with normal Pap smears since   . Autoimmune retinopathy (HCC)     Past Surgical History:  Past Surgical History:  Procedure Laterality Date  . LEETZ  2001  . TONSILLECTOMY AND ADENOIDECTOMY    . TUBAL LIGATION  2005  . Tubes in Ears      Gynecologic History:  No LMP recorded. Patient is premenopausal. Last Pap: 2019 Results were:  no abnormalities  Last mammogram: 2 years ago Results were: BI-RAD I  Obstetric History: [id number]  Family History:  Family History  Problem Relation Age of Onset  . Hypertension Father   . Stroke Father   . Arthritis/Rheumatoid Cousin   . Breast cancer Neg Hx     Social History:  Social History   Socioeconomic History  . Marital status: Married    Spouse name: Not on file  . Number of children: 2  . Years of education: Not on file  . Highest education level: Not on file  Occupational History  . Not on file  Tobacco Use  . Smoking status: Former Smoker    Years: 1.00  . Smokeless tobacco: Never Used  Vaping Use  . Vaping Use: Never used  Substance and Sexual Activity  . Alcohol use: Yes    Comment: rare  . Drug use: No  . Sexual activity: Yes    Partners:  Male    Birth control/protection: Surgical    Comment: BTL  Other Topics Concern  . Not on file  Social History Narrative  . Not on file   Social Determinants of Health   Financial Resource Strain: Not on file  Food Insecurity: Not on file  Transportation Needs: Not on file  Physical Activity: Not on file  Stress: Not on file  Social Connections: Not on file  Intimate Partner Violence: Not on file     Allergies:  Allergies  Allergen Reactions  . Amoxicillin Rash, Other (See Comments) and Hives    Calves got tight, joints got loose, and then got hives Loose joints  . Ibuprofen Itching     possible allergy  . Etodolac Hives  . Sulfa Antibiotics Hives    Medications: Prior to Admission medications   Medication Sig Start Date End Date Taking? Authorizing Provider  Calcium Carbonate-Vitamin D 600-400 MG-UNIT tablet Take by mouth.   Yes [provider]  Garlic 1000 MG CAPS Take by mouth.   Yes [provider]  ibuprofen (ADVIL,MOTRIN) 600 MG tablet TAKE 1 TABLET 3 TIMES A DAY AS NEEDED. 12/02/13  Yes [provider]  Multiple Vitamin (MULTIVITAMIN) capsule Take 1 capsule by mouth daily.   Yes [provider]  Omega-3 Fatty Acids (FISH OIL PO) Take 1 tablet by mouth daily.   Yes [provider]  triamcinolone acetonide (KENALOG-40) 40 MG/ML injection Inject 75 mg into the muscle once.   Yes [provider]  Acetaminophen (TYLENOL PO) Take by mouth.    [provider]    Physical Exam Vitals: Blood pressure 140/77, height 5\' 3"  (1.6 m), weight 134 lb 6.4 oz (61 kg).  General: NAD HEENT: normocephalic, anicteric Thyroid: no enlargement, no palpable nodules Pulmonary: No increased work of breathing, CTAB Cardiovascular: RRR, distal pulses 2+ Breast: Breast symmetrical, no tenderness, no palpable nodules or masses, no skin or nipple retraction present, no nipple discharge.  No axillary or supraclavicular lymphadenopathy. Abdomen: NABS, soft, non-tender, non-distended.  Umbilicus without lesions.  No hepatomegaly, splenomegaly or masses palpable. No evidence of hernia  Genitourinary:  External: Normal external female genitalia.  Normal urethral meatus, normal Bartholin's and Skene's glands.    Vagina: Normal vaginal mucosa, no evidence of prolapse.    Cervix: Grossly normal in appearance, no bleeding  Uterus:  Non-enlarged, mobile, normal contour.  No CMT  Adnexa: ovaries non-enlarged, no adnexal masses  Rectal: deferred  Lymphatic: no evidence of inguinal lymphadenopathy Extremities: no edema, erythema, or tenderness Neurologic: Grossly intact Psychiatric: mood appropriate, affect full  Female chaperone present for pelvic and breast  portions of the physical exam     Assessment: 54 y.o. G2P2002 routine annual exam  Plan: Problem List Items Addressed This Visit   None   Visit Diagnoses    Well woman exam with routine gynecological exam    -  Primary   Relevant Orders   Cytology - PAP   Cervicovaginal ancillary only   MM DIGITAL SCREENING BILATERAL   Lipid Panel With LDL/HDL Ratio   Cologuard   Cervical cancer screening       Relevant Orders   Cytology - PAP   Vaginal odor       Relevant Orders   Cervicovaginal ancillary only   Breast screening       Relevant Orders   MM DIGITAL SCREENING BILATERAL   Screening cholesterol level       Relevant Orders   Lipid Panel With LDL/HDL Ratio  Colon cancer screening       Relevant Orders   Cologuard   Blood tests for routine general physical examination       Relevant Orders   Lipid Panel With LDL/HDL Ratio   History of abnormal cervical Pap smear       Relevant Orders   Cytology - PAP      1) Mammogram - recommend yearly screening mammogram.  Mammogram Was ordered today  2) STI screening  was offered and declined  3) ASCCP guidelines and rationale discussed.  Patient opts for screening today due to her past history of abnormal PAP screening interval  4) Osteoporosis  - per USPTF routine screening DEXA at age 69  Consider FDA-approved medical therapies in postmenopausal women and men aged 65 years and older, based on the following: a) A hip or vertebral (clinical or morphometric) fracture b) T-score ? -2.5 at the femoral neck or spine after appropriate evaluation to exclude secondary causes C) Low bone mass (T-score  between -1.0 and -2.5 at the femoral neck or spine) and a 10-year probability of a hip fracture ? 3% or a 10-year probability of a major osteoporosis-related fracture ? 20% based on the [place]-adapted WHO algorithm   5) Routine healthcare maintenance including cholesterol, diabetes screening discussed: Cholesterol screening ordered today  6) Colonoscopy: Cologuard ordered per patient preference.   Screening recommended starting at age 49 for average risk individuals, age 40 for individuals deemed at increased risk (including African Americans) and recommended to continue until age 83.  For patient age 60-85 individualized approach is recommended.  Gold standard screening is via colonoscopy, Cologuard screening is an acceptable alternative for patient unwilling or unable to undergo colonoscopy.  "Colorectal cancer screening for average?risk adults: 2018 guideline update from the American Cancer Society"[place]: A Cancer Journal for Clinicians: Sep 19, 2016   7) Return in about 1 year (around 04/20/2021) for annual established gyn.   Parke Poisson, CNM [facility] 04/20/20, 4:45 PM

## 2020-04-20 NOTE — Patient Instructions (Signed)
Health Maintenance, Female Adopting a healthy lifestyle and getting preventive care are important in promoting health and wellness. Ask your health care provider about:  The right schedule for you to have regular tests and exams.  Things you can do on your own to prevent diseases and keep yourself healthy. What should I know about diet, weight, and exercise? Eat a healthy diet   Eat a diet that includes plenty of vegetables, fruits, low-fat dairy products, and lean protein.  Do not eat a lot of foods that are high in solid fats, added sugars, or sodium. Maintain a healthy weight Body mass index (BMI) is used to identify weight problems. It estimates body fat based on height and weight. Your health care provider can help determine your BMI and help you achieve or maintain a healthy weight. Get regular exercise Get regular exercise. This is one of the most important things you can do for your health. Most adults should:  Exercise for at least 150 minutes each week. The exercise should increase your heart rate and make you sweat (moderate-intensity exercise).  Do strengthening exercises at least twice a week. This is in addition to the moderate-intensity exercise.  Spend less time sitting. Even light physical activity can be beneficial. Watch cholesterol and blood lipids Have your blood tested for lipids and cholesterol at 54 years of age, then have this test every 5 years. Have your cholesterol levels checked more often if:  Your lipid or cholesterol levels are high.  You are older than 54 years of age.  You are at high risk for heart disease. What should I know about cancer screening? Depending on your health history and family history, you may need to have cancer screening at various ages. This may include screening for:  Breast cancer.  Cervical cancer.  Colorectal cancer.  Skin cancer.  Lung cancer. What should I know about heart disease, diabetes, and high blood  pressure? Blood pressure and heart disease  High blood pressure causes heart disease and increases the risk of stroke. This is more likely to develop in people who have high blood pressure readings, are of African descent, or are overweight.  Have your blood pressure checked: ? Every 3-5 years if you are 18-39 years of age. ? Every year if you are 40 years old or older. Diabetes Have regular diabetes screenings. This checks your fasting blood sugar level. Have the screening done:  Once every three years after age 40 if you are at a normal weight and have a low risk for diabetes.  More often and at a younger age if you are overweight or have a high risk for diabetes. What should I know about preventing infection? Hepatitis B If you have a higher risk for hepatitis B, you should be screened for this virus. Talk with your health care provider to find out if you are at risk for hepatitis B infection. Hepatitis C Testing is recommended for:  Everyone born from 1945 through 1965.  Anyone with known risk factors for hepatitis C. Sexually transmitted infections (STIs)  Get screened for STIs, including gonorrhea and chlamydia, if: ? You are sexually active and are younger than 54 years of age. ? You are older than 54 years of age and your health care provider tells you that you are at risk for this type of infection. ? Your sexual activity has changed since you were last screened, and you are at increased risk for chlamydia or gonorrhea. Ask your health care provider if   you are at risk.  Ask your health care provider about whether you are at high risk for HIV. Your health care provider may recommend a prescription medicine to help prevent HIV infection. If you choose to take medicine to prevent HIV, you should first get tested for HIV. You should then be tested every 3 months for as long as you are taking the medicine. Pregnancy  If you are about to stop having your period (premenopausal) and  you may become pregnant, seek counseling before you get pregnant.  Take 400 to 800 micrograms (mcg) of folic acid every day if you become pregnant.  Ask for birth control (contraception) if you want to prevent pregnancy. Osteoporosis and menopause Osteoporosis is a disease in which the bones lose minerals and strength with aging. This can result in bone fractures. If you are 65 years old or older, or if you are at risk for osteoporosis and fractures, ask your health care provider if you should:  Be screened for bone loss.  Take a calcium or vitamin D supplement to lower your risk of fractures.  Be given hormone replacement therapy (HRT) to treat symptoms of menopause. Follow these instructions at home: Lifestyle  Do not use any products that contain nicotine or tobacco, such as cigarettes, e-cigarettes, and chewing tobacco. If you need help quitting, ask your health care provider.  Do not use street drugs.  Do not share needles.  Ask your health care provider for help if you need support or information about quitting drugs. Alcohol use  Do not drink alcohol if: ? Your health care provider tells you not to drink. ? You are pregnant, may be pregnant, or are planning to become pregnant.  If you drink alcohol: ? Limit how much you use to 0-1 drink a day. ? Limit intake if you are breastfeeding.  Be aware of how much alcohol is in your drink. In the [place] one drink equals one 12 oz bottle of beer (355 mL), one 5 oz glass of wine (148 mL), or one 1 oz glass of hard liquor (44 mL). General instructions  Schedule regular health, dental, and eye exams.  Stay current with your vaccines.  Tell your health care provider if: ? You often feel depressed. ? You have ever been abused or do not feel safe at home. Summary  Adopting a healthy lifestyle and getting preventive care are important in promoting health and wellness.  Follow your health care provider's instructions about healthy  diet, exercising, and getting tested or screened for diseases.  Follow your health care provider's instructions on monitoring your cholesterol and blood pressure. This information is not intended to replace advice given to you by your health care provider. Make sure you discuss any questions you have with your health care provider. Document Revised: 04/02/2018 Document Reviewed: 04/02/2018 Elsevier Patient Education  2020 Elsevier Inc.  

## 2020-04-21 LAB — LIPID PANEL WITH LDL/HDL RATIO
Cholesterol, Total: 252 mg/dL — ABNORMAL HIGH (ref 100–199)
HDL: 63 mg/dL (ref >39)
LDL Chol Calc (NIH): 169 mg/dL — ABNORMAL HIGH (ref 0–99)
LDL/HDL Ratio: 2.7 ratio (ref 0.0–3.2)
Triglycerides: 111 mg/dL (ref 0–149)
VLDL Cholesterol Cal: 20 mg/dL (ref 5–40)

## 2020-04-21 NOTE — Progress Notes (Signed)
Was in my in box

## 2020-04-26 LAB — CYTOLOGY - PAP
Comment: NEGATIVE
Diagnosis: NEGATIVE
High risk HPV: NEGATIVE

## 2020-11-30 ENCOUNTER — Ambulatory Visit
Admission: RE | Admit: 2020-11-30 | Discharge: 2020-11-30 | Disposition: A | Discharge status: Home / Self Care | Payer: BC Managed Care – PPO | Source: Ambulatory Visit | Attending: Advanced Practice Midwife | Admitting: Advanced Practice Midwife

## 2020-11-30 ENCOUNTER — Other Ambulatory Visit: Payer: Self-pay

## 2020-11-30 DIAGNOSIS — Z01419 Encounter for gynecological examination (general) (routine) without abnormal findings: Secondary | ICD-10-CM | POA: Insufficient documentation

## 2020-11-30 DIAGNOSIS — Z1231 Encounter for screening mammogram for malignant neoplasm of breast: Secondary | ICD-10-CM | POA: Diagnosis not present

## 2020-11-30 DIAGNOSIS — Z1239 Encounter for other screening for malignant neoplasm of breast: Secondary | ICD-10-CM

## 2021-04-21 ENCOUNTER — Other Ambulatory Visit: Payer: Self-pay

## 2021-04-21 ENCOUNTER — Encounter: Payer: Self-pay | Admitting: Advanced Practice Midwife

## 2021-04-21 ENCOUNTER — Ambulatory Visit (INDEPENDENT_AMBULATORY_CARE_PROVIDER_SITE_OTHER): Payer: BC Managed Care – PPO | Admitting: Advanced Practice Midwife

## 2021-04-21 VITALS — BP 128/74 | HR 95 | Ht 63.0 in | Wt 139.0 lb

## 2021-04-21 DIAGNOSIS — Z1239 Encounter for other screening for malignant neoplasm of breast: Secondary | ICD-10-CM | POA: Diagnosis not present

## 2021-04-21 DIAGNOSIS — Z1211 Encounter for screening for malignant neoplasm of colon: Secondary | ICD-10-CM | POA: Diagnosis not present

## 2021-04-21 DIAGNOSIS — Z01419 Encounter for gynecological examination (general) (routine) without abnormal findings: Secondary | ICD-10-CM | POA: Diagnosis not present

## 2021-04-21 NOTE — Progress Notes (Signed)
Gynecology Annual Exam  PCP: Rod Can, CNM  Chief Complaint:  Chief Complaint  Patient presents with   Gynecologic Exam    Annual - need GI referral. RM 5    History of Present Illness:Patient is a 56 y.o. Postmenopausal G2P2002 presents for her annual exam. The patient has no gyn complaints today. Her mother recently died and had colon cancer. She would like a referral for a colonoscopy.   LMP: Patient's last menstrual period was 04/08/2018 (exact date).   The patient is sexually active. She denies dyspareunia.  The patient does perform self breast exams.  There is no notable family history of breast or ovarian cancer in her family.  The patient wears seatbelts: yes.   The patient has regular exercise:  She does a Scientist, research (medical) workout and also regular walking. She admits healthy diet, hydration and sleep .    The patient denies current symptoms of depression.     Review of Systems: Review of Systems  Constitutional:  Negative for chills and fever.  HENT:  Negative for congestion, ear discharge, ear pain, hearing loss, sinus pain and sore throat.   Eyes:  Negative for blurred vision and double vision.  Respiratory:  Negative for cough, shortness of breath and wheezing.   Cardiovascular:  Negative for chest pain, palpitations and leg swelling.  Gastrointestinal:  Negative for abdominal pain, blood in stool, constipation, diarrhea, heartburn, melena, nausea and vomiting.  Genitourinary:  Negative for dysuria, flank pain, frequency, hematuria and urgency.  Musculoskeletal:  Negative for back pain, joint pain and myalgias.  Skin:  Negative for itching and rash.  Neurological:  Negative for dizziness, tingling, tremors, sensory change, speech change, focal weakness, seizures, loss of consciousness, weakness and headaches.  Endo/Heme/Allergies:  Negative for environmental allergies. Does not bruise/bleed easily.  Psychiatric/Behavioral:  Negative for depression,  hallucinations, memory loss, substance abuse and suicidal ideas. The patient is not nervous/anxious and does not have insomnia.    Past Medical History:  Patient Active Problem List   Diagnosis Date Noted   Mitral valve prolapse 04/20/2020   History of cervical dysplasia 12/11/2016    Had a LEETZ in 2001 with normal Pap smears since    Autoimmune retinopathy [facility])     Past Surgical History:  Past Surgical History:  Procedure Laterality Date   LEETZ  2001   TONSILLECTOMY AND ADENOIDECTOMY     TUBAL LIGATION  2005   Tubes in Ears      Gynecologic History:  Patient's last menstrual period was 04/08/2018 (exact date). Last Pap: 1 year ago Results were:  no abnormalities  Last mammogram: 4 months ago Results were: BI-RAD I  Obstetric History: [id number]  Family History:  Family History  Problem Relation Age of Onset   Colon cancer Mother    Hypertension Father    Stroke Father    Arthritis/Rheumatoid Cousin    Breast cancer Neg Hx     Social History:  Social History   Socioeconomic History   Marital status: Married    Spouse name: Not on file   Number of children: 2   Years of education: Not on file   Highest education level: Not on file  Occupational History   Not on file  Tobacco Use   Smoking status: Former    Years: 1.00    Types: Cigarettes   Smokeless tobacco: Never  Vaping Use   Vaping Use: Never used  Substance and Sexual Activity   Alcohol use: Yes  Comment: rare   Drug use: No   Sexual activity: Yes    Partners: Male    Birth control/protection: Surgical    Comment: BTL  Other Topics Concern   Not on file  Social History Narrative   Not on file   Social Determinants of Health   Financial Resource Strain: Not on file  Food Insecurity: Not on file  Transportation Needs: Not on file  Physical Activity: Not on file  Stress: Not on file  Social Connections: Not on file  Intimate Partner Violence: Not on file    Allergies:  Allergies   Allergen Reactions   Amoxicillin Rash, Other (See Comments) and Hives    Calves got tight, joints got loose, and then got hives Loose joints   Ibuprofen Itching     possible allergy   Etodolac Hives   Sulfa Antibiotics Hives    Medications: Prior to Admission medications   Medication Sig Start Date End Date Taking? Authorizing Provider  Acetaminophen (TYLENOL PO) Take by mouth.   Yes [provider]  Calcium Carbonate-Vitamin D 600-400 MG-UNIT tablet Take by mouth.   Yes [provider]  triamcinolone acetonide (KENALOG-40) 40 MG/ML injection Inject 75 mg into the muscle once.    [provider]    Physical Exam Vitals: Blood pressure 128/74, pulse 95, height 5\' 3"  (1.6 m), weight 139 lb (63 kg), last menstrual period 04/08/2018.  General: NAD HEENT: normocephalic, anicteric Thyroid: no enlargement, no palpable nodules Pulmonary: No increased work of breathing, CTAB Cardiovascular: RRR, distal pulses 2+ Breast: Breast symmetrical, no tenderness, no palpable nodules or masses, no skin or nipple retraction present, no nipple discharge.  No axillary or supraclavicular lymphadenopathy. Abdomen: NABS, soft, non-tender, non-distended.  Umbilicus without lesions.  No hepatomegaly, splenomegaly or masses palpable. No evidence of hernia  Genitourinary:  External: Normal external female genitalia.  Normal urethral meatus, normal Bartholin's and Skene's glands.    Vagina: Normal vaginal mucosa, no evidence of prolapse.    Cervix: not evaluated  Uterus: Non-enlarged, mobile, normal contour.    Adnexa: ovaries non-enlarged, no adnexal masses  Rectal: deferred  Lymphatic: no evidence of inguinal lymphadenopathy Extremities: no edema, erythema, or tenderness Neurologic: Grossly intact Psychiatric: mood appropriate, affect full    Assessment: 55 y.o. G2P2002 routine annual exam  Plan: Problem List Items Addressed This Visit   None Visit Diagnoses     Well  woman exam with routine gynecological exam    -  Primary   Colon cancer screening       Relevant Orders   Ambulatory referral to Gastroenterology   Breast screening       Relevant Orders   MM 3D SCREEN BREAST BILATERAL       1) Mammogram - recommend yearly screening mammogram.  Mammogram Was ordered today  2) STI screening  was offered and declined  3) ASCCP guidelines and rationale discussed.  Patient opts for every 3 years screening interval  4) Osteoporosis  - per USPTF routine screening DEXA at age 44  Consider FDA-approved medical therapies in postmenopausal women and men aged 70 years and older, based on the following: a) A hip or vertebral (clinical or morphometric) fracture b) T-score ? -2.5 at the femoral neck or spine after appropriate evaluation to exclude secondary causes C) Low bone mass (T-score between -1.0 and -2.5 at the femoral neck or spine) and a 10-year probability of a hip fracture ? 3% or a 10-year probability of a major osteoporosis-related fracture ? 20%  based on the [place]-adapted WHO algorithm   5) Routine healthcare maintenance including cholesterol, diabetes screening discussed Declines  6) Colonoscopy was ordered today.  Screening recommended starting at age 26 for average risk individuals, age 53 for individuals deemed at increased risk (including African Americans) and recommended to continue until age 12.  For patient age 65-85 individualized approach is recommended.  Gold standard screening is via colonoscopy, Cologuard screening is an acceptable alternative for patient unwilling or unable to undergo colonoscopy.  "Colorectal cancer screening for average?risk adults: 2018 guideline update from the [place]: A Cancer Journal for Clinicians: Sep 19, 2016   7) Return in about 1 year (around 04/21/2022) for annual established gyn.    Christean Leaf, CNM [facility] 04/21/21, 4:32  PM

## 2021-04-21 NOTE — Patient Instructions (Signed)

## 2021-04-25 ENCOUNTER — Telehealth: Payer: Self-pay

## 2021-04-25 NOTE — Telephone Encounter (Signed)
CALLED PATIENT NO ANSWER LEFT VOICEMAIL FOR A CALL BACK ? ?

## 2021-04-26 ENCOUNTER — Telehealth: Payer: Self-pay

## 2021-04-26 NOTE — Telephone Encounter (Signed)
CALLED PATIENT NO ANSWER LEFT VOICEMAIL FOR A CALL BACK ? ?

## 2021-04-26 NOTE — Telephone Encounter (Signed)
CALLED PATIENT NO ANSWER LEFT VOICEMAIL FOR A CALL BACK °Letter sent °

## 2022-01-09 ENCOUNTER — Ambulatory Visit: Payer: BC Managed Care – PPO | Admitting: Obstetrics & Gynecology

## 2022-01-16 ENCOUNTER — Other Ambulatory Visit (HOSPITAL_COMMUNITY)
Admission: RE | Admit: 2022-01-16 | Discharge: 2022-01-16 | Disposition: A | Discharge status: Home / Self Care | Payer: BC Managed Care – PPO | Source: Ambulatory Visit | Attending: Obstetrics & Gynecology | Admitting: Obstetrics & Gynecology

## 2022-01-16 ENCOUNTER — Ambulatory Visit: Payer: BC Managed Care – PPO | Admitting: Obstetrics & Gynecology

## 2022-01-16 ENCOUNTER — Encounter: Payer: Self-pay | Admitting: Obstetrics & Gynecology

## 2022-01-16 VITALS — BP 110/60 | Ht 63.0 in | Wt 137.0 lb

## 2022-01-16 DIAGNOSIS — N841 Polyp of cervix uteri: Secondary | ICD-10-CM | POA: Diagnosis not present

## 2022-01-16 DIAGNOSIS — N812 Incomplete uterovaginal prolapse: Secondary | ICD-10-CM | POA: Diagnosis not present

## 2022-01-16 NOTE — Progress Notes (Signed)
   Established Patient Office Visit  Subjective   Patient ID: Robin Wise, female    DOB: 1965-06-07  Age: 56 y.o. MRN: [id number]  No chief complaint on file.   HPI    56 yo married P2 here because she noted a bulge coming from her vagina last week. She denies any problems with BMs or voiding.  She is sexually active. Her children were born vaginally. Objective:     LMP 04/08/2018 (Exact Date)    Physical Exam   Well nourished, well hydrated White female, no apparent distress She is ambulating and conversing normally. EG- normal With Valsalva she has a Grade 2 cystocele Speculum used and cervix looks patulous with a small polyp, normal, but grade 1 prolapse She has significant side wall laxity I prepped her cervix with betadine after consenting her for this. I then twisted the polyp off with a ring forceps. I used silver nitrate to cauterize the base of the polyp.  Bimanual exam- normal size and shape, anteverted uterus, normal adnexal exam  Assessment & Plan:   Problem List Items Addressed This Visit   None   She will come back for pessary fitting, not interested in surgery at this time.   Emily Filbert, MD

## 2022-01-18 LAB — SURGICAL PATHOLOGY

## 2022-02-27 ENCOUNTER — Encounter: Payer: Self-pay | Admitting: Obstetrics & Gynecology

## 2022-02-27 ENCOUNTER — Ambulatory Visit: Payer: BC Managed Care – PPO | Admitting: Obstetrics & Gynecology

## 2022-02-27 VITALS — BP 120/80 | Ht 63.0 in | Wt 135.0 lb

## 2022-02-27 DIAGNOSIS — N812 Incomplete uterovaginal prolapse: Secondary | ICD-10-CM | POA: Insufficient documentation

## 2022-02-27 DIAGNOSIS — N816 Rectocele: Secondary | ICD-10-CM | POA: Diagnosis not present

## 2022-02-27 NOTE — Progress Notes (Signed)
   Established Patient Office Visit  Subjective   Patient ID: Robin Wise, female    DOB: 22-Aug-1965  Age: 56 y.o. MRN: [id number]  Chief Complaint  Patient presents with   pessary fitting    HPI    56 yo married P2 here for a pessary fitting. She has noticed worsening POP symptoms.  Objective:     BP 120/80   Ht 5\' 3"  (1.6 m)   Wt 135 lb (61.2 kg)   LMP 04/08/2018 (Exact Date)   BMI 23.91 kg/m    Physical Exam  The 10-year ASCVD risk score (Arnett DK, et al., 2019) is: 2.2%   Well nourished, well hydrated White female, no apparent distress She is ambulating and conversing normally. EG- normal With Valsalva, she now has a Grade 2 cystocele and Grade 2 rectocele along with Grade 1 uterine prolapse. I fitted her with a #3 ring with support. This relieved her prolapse symptoms and did not cause any discomfort.  Assessment & Plan:  Cystocele, rectocele, uterine prolapse - I will have a #3 ring with support ordered and she will come back next week to make sure that she can insert and remove it.  Problem List Items Addressed This Visit   None   No follow-ups on file.    Emily Filbert, MD

## 2022-03-05 ENCOUNTER — Telehealth: Payer: Self-pay

## 2022-03-05 NOTE — Telephone Encounter (Signed)
Patient called to check to see if her pessary was in. She has an appointment for insertion tomorrow at 4, she wanted to be sure it was here before then. I checked with Rocco Serene, she said pessary should be here tomorrow morning. Patient was informed, she will call back tomorrow to see if it arrived.

## 2022-03-06 ENCOUNTER — Ambulatory Visit: Payer: BC Managed Care – PPO | Admitting: Obstetrics & Gynecology

## 2022-03-06 VITALS — BP 117/61 | HR 81 | Ht 63.0 in | Wt 137.0 lb

## 2022-03-06 DIAGNOSIS — N812 Incomplete uterovaginal prolapse: Secondary | ICD-10-CM

## 2022-03-06 DIAGNOSIS — Z4689 Encounter for fitting and adjustment of other specified devices: Secondary | ICD-10-CM | POA: Diagnosis not present

## 2022-03-06 NOTE — Progress Notes (Signed)
   Established Patient Office Visit  Subjective   Patient ID: Robin Wise, female    DOB: 1966/03/11  Age: 56 y.o. MRN: [id number]  Chief Complaint  Patient presents with   Follow-up    Pessary    HPI    56 yo married P2 here to pick up her new #3 ring with support pessary.   ROS- pap normal 2021  Objective:     BP 117/61   Pulse 81   Ht 5\' 3"  (1.6 m)   Wt 137 lb (62.1 kg)   LMP 04/08/2018 (Exact Date)   BMI 24.27 kg/m    Physical Exam   Well nourished, well hydrated White female, no apparent distress She is ambulating and conversing normally. Grade 2 cystocele and Grade 1 uterine prolapse She was able to insert and remove it without difficulty.  Assessment & Plan:  POP, symptomatic Pessary to be removed at least weekly Pelvic PT ordered She is also aware that there are surgeries to help with this issue.  Problem List Items Addressed This Visit   None   No follow-ups on file.    04/10/2018, MD

## 2022-03-28 ENCOUNTER — Telehealth: Payer: Self-pay

## 2022-03-28 DIAGNOSIS — Z1211 Encounter for screening for malignant neoplasm of colon: Secondary | ICD-10-CM

## 2022-03-28 NOTE — Telephone Encounter (Signed)
Pt calling triage to f/u on colonoscopy referral. She said she called a few days ago and still has not heard back. I apologized for the delay. I told her msg will be taken and sent to Dr. Marice Potter who returns next week to clinic.

## 2022-04-04 ENCOUNTER — Other Ambulatory Visit: Payer: Self-pay

## 2022-04-04 ENCOUNTER — Telehealth: Payer: Self-pay

## 2022-04-04 DIAGNOSIS — Z800 Family history of malignant neoplasm of digestive organs: Secondary | ICD-10-CM

## 2022-04-04 DIAGNOSIS — Z1211 Encounter for screening for malignant neoplasm of colon: Secondary | ICD-10-CM

## 2022-04-04 MED ORDER — SUFLAVE 178.7 G PO SOLR: 1.0000 | ORAL | 0 refills | Status: AC

## 2022-04-04 NOTE — Telephone Encounter (Signed)
Gastroenterology Pre-Procedure Review  Request Date: 05/07/22 Requesting Physician: Dr. Servando Snare  PATIENT REVIEW QUESTIONS: The patient responded to the following health history questions as indicated:    1. Are you having any GI issues? no 2. Do you have a personal history of Polyps no 3. Do you have a family history of Colon Cancer or Polyps? yes (mother colon cancer, 2 sisters had precancerous polyps) 4. Diabetes Mellitus? no 5. Joint replacements in the past 12 months?no 6. Major health problems in the past 3 months?no 7. Any artificial heart valves, MVP, or defibrillator?no patient has MVP. Her cardiologist is Dr.Parachos however has not been seen in a while.  Cardiac Clearance sent to his office.    MEDICATIONS & ALLERGIES:    Patient reports the following regarding taking any anticoagulation/antiplatelet therapy:   Plavix, Coumadin, Eliquis, Xarelto, Lovenox, Pradaxa, Brilinta, or Effient? no Aspirin? no  Patient confirms/reports the following medications:  Current Outpatient Medications  Medication Sig Dispense Refill   Acetaminophen (TYLENOL PO) Take by mouth.     Acetylcysteine (NAC PO) Take by mouth.     Beta Carotene (VITAMIN A) 25000 UNIT capsule Take 10,000 Units by mouth daily.     Calcium Carbonate-Vitamin D 600-400 MG-UNIT tablet Take by mouth.     triamcinolone acetonide (KENALOG-40) 40 MG/ML injection Inject 75 mg into the muscle once.     No current facility-administered medications for this visit.    Patient confirms/reports the following allergies:  Allergies  Allergen Reactions   Amoxicillin Rash, Other (See Comments) and Hives    Calves got tight, joints got loose, and then got hives Loose joints   Ibuprofen Itching     possible allergy   Etodolac Hives   Sulfa Antibiotics Hives    No orders of the defined types were placed in this encounter.   AUTHORIZATION INFORMATION Primary Insurance: 1D#: Group #:  Secondary Insurance: 1D#: Group  #:  SCHEDULE INFORMATION: Date: 05/07/22 Time: Location: [facility]

## 2022-04-25 ENCOUNTER — Encounter: Payer: Self-pay | Admitting: Gastroenterology

## 2022-04-30 ENCOUNTER — Telehealth: Payer: Self-pay

## 2022-04-30 ENCOUNTER — Telehealth: Payer: Self-pay | Admitting: Gastroenterology

## 2022-04-30 DIAGNOSIS — Z1211 Encounter for screening for malignant neoplasm of colon: Secondary | ICD-10-CM

## 2022-04-30 NOTE — Telephone Encounter (Signed)
Patient has tested positive for covid. Needs to reschedule upcoming procedure. Requesting call back at home number.

## 2022-04-30 NOTE — Telephone Encounter (Signed)
Colonoscopy R/S to 06/04/22 due to COVID.  Cardiac Clearance still pending however patient did say that the cardiologist cleared her.  Just awaiting [facility] to fax it to [place].  Kim at [facility] has been notified of date change.  Thanks,  [place], [place]

## 2022-04-30 NOTE — Telephone Encounter (Signed)
Voice message has been left for patient for her to call me back in regards to her scheduled colonoscopy with Dr. Allen Norris on 05/07/22 at [facility].  We have not yet received cardiac clearance.  Wanted to let her know that we are still awaiting clearance, and we will need to reschedule her if clearance has not been received by Thu/Fri.  I have also contacted [facility] to get an update on the request. It was sent on 04/05/23 and she had a visit with them on 04/24/22  Thanks,  [place], CMA

## 2022-05-04 NOTE — Telephone Encounter (Signed)
Patient clearance was granted for colonoscopy.  Clearance was noted in chart on 04/24/22 pre-op visit with Jeraldine Loots [facility].  Noted "Proceed with colonoscopy without additional preoperative cardiac diagnostics required".   Thanks,  [place], [place]

## 2022-05-10 ENCOUNTER — Encounter: Payer: Self-pay | Admitting: Obstetrics & Gynecology

## 2022-05-10 ENCOUNTER — Ambulatory Visit (INDEPENDENT_AMBULATORY_CARE_PROVIDER_SITE_OTHER): Payer: BC Managed Care – PPO | Admitting: Obstetrics & Gynecology

## 2022-05-10 ENCOUNTER — Other Ambulatory Visit (HOSPITAL_COMMUNITY)
Admission: RE | Admit: 2022-05-10 | Discharge: 2022-05-10 | Disposition: A | Discharge status: Home / Self Care | Payer: BC Managed Care – PPO | Source: Ambulatory Visit | Attending: Obstetrics & Gynecology | Admitting: Obstetrics & Gynecology

## 2022-05-10 VITALS — BP 120/80 | Ht 63.0 in | Wt 140.0 lb

## 2022-05-10 DIAGNOSIS — Z01419 Encounter for gynecological examination (general) (routine) without abnormal findings: Secondary | ICD-10-CM

## 2022-05-10 DIAGNOSIS — N812 Incomplete uterovaginal prolapse: Secondary | ICD-10-CM | POA: Diagnosis not present

## 2022-05-10 DIAGNOSIS — Z124 Encounter for screening for malignant neoplasm of cervix: Secondary | ICD-10-CM

## 2022-05-10 DIAGNOSIS — Z1231 Encounter for screening mammogram for malignant neoplasm of breast: Secondary | ICD-10-CM

## 2022-05-10 NOTE — Progress Notes (Signed)
Subjective:    Robin Wise is a 57 y.o. married P2  who presents for an annual exam. The patient has no complaints today. The patient is sexually active. GYN screening history: last pap: was normal. The patient wears seatbelts: yes. The patient participates in regular exercise: yes. Has the patient ever been transfused or tattooed?: no. The patient reports that there is not domestic violence in her life.   Menstrual History: OB History     Gravida  2   Para  2   Term  2   Preterm      AB      Living  2      SAB      IAB      Ectopic      Multiple      Live Births  2           Menarche age: 43 Patient's last menstrual period was 04/08/2018 (exact date).    The following portions of the patient's history were reviewed and updated as appropriate: allergies, current medications, past family history, past medical history, past social history, past surgical history, and problem list.  Review of Systems Pertinent items are noted in HPI.  FH- no breast/gyn cancers + colon cancer in her mom Her colonscopy is scheduled for next month. She has some hot flashes but is not bothered by these terribly. Her LMP was about 4 years ago. 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    Objective:    BP 120/80   Ht 5\' 3"  (1.6 m)   Wt 140 lb (63.5 kg)   LMP 04/08/2018 (Exact Date)   BMI 24.80 kg/m   General Appearance:    Alert, cooperative, no distress, appears stated age  Head:    Normocephalic, without obvious abnormality, atraumatic  Eyes:    PERRL, conjunctiva/corneas clear, EOM's intact, fundi    benign, both eyes  Ears:    Normal TM's and external ear canals, both ears  Nose:   Nares normal, septum midline, mucosa normal, no drainage    or sinus tenderness  Throat:   Lips, mucosa, and tongue normal; teeth and gums normal  Neck:   Supple, symmetrical, trachea midline, no adenopathy;    thyroid:  no enlargement/tenderness/nodules; no carotid   bruit or  JVD  Back:     Symmetric, no curvature, ROM normal, no CVA tenderness  Lungs:     Clear to auscultation bilaterally, respirations unlabored  Chest Wall:    No tenderness or deformity   Heart:    Regular rate and rhythm, S1 and S2 normal, no murmur, rub   or gallop  Breast Exam:    No tenderness, masses, or nipple abnormality  Abdomen:     Soft, non-tender, bowel sounds active all four quadrants,    no masses, no organomegaly  Genitalia:    Normal female without lesion, discharge or tenderness, moderate atrophy, Grade 2 cystocele, She has significant side wall laxity, normal size and shape, anteverted uterus, normal adnexal exam      Extremities:   Extremities normal, atraumatic, no cyanosis or edema  Pulses:   2+ and symmetric all extremities  Skin:   Skin color, texture, turgor normal, no rashes or lesions  Lymph nodes:   Cervical, supraclavicular, and axillary nodes normal  Neurologic:   CNII-XII intact, normal strength, sensation and reflexes    throughout  .    Assessment:    Healthy female exam.  Cystocele and mild uterine  prolapse- she is happy with her pessary She wants a referral for pelvic PT   Plan:     Mammogram. Ordered Thin prep pap smear with HPV cotesting

## 2022-05-17 LAB — CYTOLOGY - PAP
Comment: NEGATIVE
Diagnosis: NEGATIVE
High risk HPV: NEGATIVE

## 2022-05-24 ENCOUNTER — Encounter: Payer: Self-pay | Admitting: Gastroenterology

## 2022-06-04 ENCOUNTER — Encounter: Payer: Self-pay | Admitting: Gastroenterology

## 2022-06-04 ENCOUNTER — Other Ambulatory Visit: Payer: Self-pay

## 2022-06-04 ENCOUNTER — Ambulatory Visit: Payer: BC Managed Care – PPO | Admitting: Anesthesiology

## 2022-06-04 ENCOUNTER — Encounter
Admission: RE | Disposition: A | Discharge status: Home / Self Care | Payer: Self-pay | Source: Home / Self Care | Attending: Gastroenterology

## 2022-06-04 ENCOUNTER — Ambulatory Visit
Admission: RE | Admit: 2022-06-04 | Discharge: 2022-06-04 | Disposition: A | Discharge status: Home / Self Care | Payer: BC Managed Care – PPO | Attending: Gastroenterology | Admitting: Gastroenterology

## 2022-06-04 DIAGNOSIS — K640 First degree hemorrhoids: Secondary | ICD-10-CM | POA: Diagnosis not present

## 2022-06-04 DIAGNOSIS — Z87891 Personal history of nicotine dependence: Secondary | ICD-10-CM | POA: Diagnosis not present

## 2022-06-04 DIAGNOSIS — Z800 Family history of malignant neoplasm of digestive organs: Secondary | ICD-10-CM | POA: Diagnosis not present

## 2022-06-04 DIAGNOSIS — Z1211 Encounter for screening for malignant neoplasm of colon: Secondary | ICD-10-CM | POA: Insufficient documentation

## 2022-06-04 HISTORY — DX: Dizziness and giddiness: R42

## 2022-06-04 HISTORY — DX: Presence of urogenital implants: Z96.0

## 2022-06-04 HISTORY — DX: Presence of spectacles and contact lenses: Z97.3

## 2022-06-04 HISTORY — PX: COLONOSCOPY WITH PROPOFOL: SHX5780

## 2022-06-04 SURGERY — COLONOSCOPY WITH PROPOFOL
Anesthesia: General

## 2022-06-04 MED ORDER — ONDANSETRON HCL 4 MG/2ML IJ SOLN: 4.0000 mg | Freq: Once | INTRAMUSCULAR | Status: DC | PRN

## 2022-06-04 MED ORDER — ACETAMINOPHEN 160 MG/5ML PO SOLN: 325.0000 mg | ORAL | Status: DC | PRN

## 2022-06-04 MED ORDER — ACETAMINOPHEN 325 MG PO TABS: 650.0000 mg | ORAL_TABLET | Freq: Once | ORAL | Status: DC | PRN

## 2022-06-04 MED ORDER — LIDOCAINE HCL (CARDIAC) PF 100 MG/5ML IV SOSY
PREFILLED_SYRINGE | INTRAVENOUS | Status: DC | PRN
  Administered 2022-06-04: 60 mg via INTRAVENOUS

## 2022-06-04 MED ORDER — LACTATED RINGERS IV SOLN: INTRAVENOUS | Status: DC

## 2022-06-04 MED ORDER — PROPOFOL 10 MG/ML IV BOLUS
INTRAVENOUS | Status: DC | PRN
  Administered 2022-06-04: 30 mg via INTRAVENOUS
  Administered 2022-06-04 (×2): 40 mg via INTRAVENOUS
  Administered 2022-06-04: 20 mg via INTRAVENOUS
  Administered 2022-06-04: 30 mg via INTRAVENOUS
  Administered 2022-06-04: 80 mg via INTRAVENOUS
  Administered 2022-06-04: 40 mg via INTRAVENOUS

## 2022-06-04 MED ORDER — SODIUM CHLORIDE 0.9 % IV SOLN: INTRAVENOUS | Status: DC

## 2022-06-04 MED ORDER — STERILE WATER FOR IRRIGATION IR SOLN
Status: DC | PRN
  Administered 2022-06-04: 1000 mL

## 2022-06-04 SURGICAL SUPPLY — 21 items

## 2022-06-04 NOTE — H&P (Signed)
Robin Lame, MD [facility] [address], [place], [place] [address] Phone: [phone] Fax : [phone]  Primary Care Physician:  Emily Filbert, MD Primary Gastroenterologist:  Dr. Allen Norris  Pre-Procedure History & Physical: HPI:  Robin Wise is a 57 y.o. female is here for a screening colonoscopy.   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 [place] maunuever fixed   Wears contact lenses     Past Surgical History:  Procedure Laterality Date   LEETZ  2001   TONSILLECTOMY AND ADENOIDECTOMY     TUBAL LIGATION  2005   Tubes in Ears      Prior to Admission medications   Medication Sig Start Date End Date Taking? Authorizing Provider  Acetaminophen (TYLENOL PO) Take by mouth.   Yes [provider]  Acetylcysteine (NAC PO) Take by mouth.   Yes [provider]  Beta Carotene (VITAMIN A) 25000 UNIT capsule Take 10,000 Units by mouth daily.   Yes [provider]  Calcium Carbonate-Vitamin D 600-400 MG-UNIT tablet Take by mouth.   Yes [provider]  dorzolamide-timolol (COSOPT) 2-0.5 % ophthalmic solution Place 1 drop into the left eye 2 (two) times daily.   Yes [provider]  OVER THE COUNTER MEDICATION daily. [place] with saffron for ocular support   Yes [provider]  triamcinolone acetonide (KENALOG-40) 40 MG/ML injection Inject 75 mg into the muscle once.   Yes [provider]  vitamin A 3 MG (10000 UNITS) capsule  10/30/21  Yes [provider]    Allergies as of 04/04/2022 - Review Complete 04/04/2022  Allergen Reaction Noted   Amoxicillin Rash, Other (See Comments), and Hives 07/12/2014   Ibuprofen Itching 04/20/2019   Etodolac Hives 06/03/2015   Sulfa antibiotics Hives 06/03/2015    Family History  Problem Relation Age of Onset    Colon cancer Mother    Hypertension Father    Stroke Father    Arthritis/Rheumatoid Cousin    Breast cancer Neg Hx     Social History   Socioeconomic History   Marital status: Married    Spouse name: Not on file   Number of children: 2   Years of education: Not on file   Highest education level: Not on file  Occupational History   Not on file  Tobacco Use   Smoking status: Former    Years: 1.00    Types: Cigarettes   Smokeless tobacco: Never   Tobacco comments:    Tried cigs, age 67  Vaping Use   Vaping Use: Never used  Substance and Sexual Activity   Alcohol use: Not Currently    Comment: rare   Drug use: No   Sexual activity: Yes    Partners: Male    Birth control/protection: Surgical    Comment: BTL  Other Topics Concern   Not on file  Social History Narrative   Not on file   Social Determinants of Health   Financial Resource Strain: Not on file  Food Insecurity: Not on file  Transportation Needs: Not on file  Physical Activity: Not on file  Stress: Not on file  Social Connections: Not on file  Intimate Partner Violence: Not on file    Review of Systems: See HPI, otherwise negative ROS  Physical Exam: BP  122/74   Pulse 79   Temp 98.1 F (36.7 C)   Resp 18   Ht 5' 3"$  (1.6 m)   Wt 61.2 kg   LMP 04/08/2018 (Exact Date)   SpO2 98%   BMI 23.91 kg/m  General:   Alert,  pleasant and cooperative in NAD Head:  Normocephalic and atraumatic. Neck:  Supple; no masses or thyromegaly. Lungs:  Clear throughout to auscultation.    Heart:  Regular rate and rhythm. Abdomen:  Soft, nontender and nondistended. Normal bowel sounds, without guarding, and without rebound.   Neurologic:  Alert and  oriented x4;  grossly normal neurologically.  Impression/Plan: [name] is now here to undergo a screening colonoscopy.  Risks, benefits, and alternatives regarding colonoscopy have been reviewed with the patient.  Questions have been answered.  All  parties agreeable.

## 2022-06-04 NOTE — Anesthesia Preprocedure Evaluation (Addendum)
Anesthesia Evaluation  Patient identified by MRN, date of birth, ID band Patient awake    Reviewed: Allergy & Precautions, NPO status , Patient's Chart, lab work & pertinent test results  History of Anesthesia Complications Negative for: history of anesthetic complications  Airway Mallampati: I   Neck ROM: Full    Dental no notable dental hx.    Pulmonary former smoker (quit age 57)   Pulmonary exam normal breath sounds clear to auscultation       Cardiovascular Exercise Tolerance: Good Normal cardiovascular exam+ Valvular Problems/Murmurs MVP  Rhythm:Regular Rate:Normal  Echo 05/10/22:  NORMAL LEFT VENTRICULAR SYSTOLIC FUNCTION  NORMAL RIGHT VENTRICULAR SYSTOLIC FUNCTION  MILD VALVULAR REGURGITATION  NO VALVULAR STENOSIS  TRIVIAL TR, PR  MILD MR  EF 55%    Neuro/Psych Vertigo     GI/Hepatic negative GI ROS,,,  Endo/Other  negative endocrine ROS    Renal/GU negative Renal ROS     Musculoskeletal   Abdominal   Peds  Hematology negative hematology ROS (+)   Anesthesia Other Findings   Reproductive/Obstetrics                             Anesthesia Physical Anesthesia Plan  ASA: 2  Anesthesia Plan: General   Post-op Pain Management:    Induction: Intravenous  PONV Risk Score and Plan: 3 and Propofol infusion, TIVA and Treatment may vary due to age or medical condition  Airway Management Planned: Natural Airway  Additional Equipment:   Intra-op Plan:   Post-operative Plan:   Informed Consent: I have reviewed the patients History and Physical, chart, labs and discussed the procedure including the risks, benefits and alternatives for the proposed anesthesia with the patient or authorized representative who has indicated his/her understanding and acceptance.       Plan Discussed with: CRNA  Anesthesia Plan Comments: (LMA/GETA backup discussed.  Patient consented for  risks of anesthesia including but not limited to:  - adverse reactions to medications - damage to eyes, teeth, lips or other oral mucosa - nerve damage due to positioning  - sore throat or hoarseness - damage to heart, brain, nerves, lungs, other parts of body or loss of life  Informed patient about role of CRNA in peri- and intra-operative care.  Patient voiced understanding.)        Anesthesia Quick Evaluation

## 2022-06-04 NOTE — Op Note (Signed)
[facility] Patient Name: Robin Wise Procedure Date: 06/04/2022 8:48 AM MRN: [id number] Account #: [id number] Date of Birth: December 12, 1965 Admit Type: Outpatient Age: 58 Room: [facility] OR ROOM 01 Gender: Female Note Status: Finalized Instrument Name: [id number] Procedure:             Colonoscopy Indications:           Screening for colorectal malignant neoplasm Providers:             Lucilla Lame MD, MD Referring MD:          Wilhemina Cash. Hulan Fray, MD (Referring MD) Medicines:             Propofol per Anesthesia Complications:         No immediate complications. Procedure:             Pre-Anesthesia Assessment:                        - Prior to the procedure, a History and Physical was                         performed, and patient medications and allergies were                         reviewed. The patient's tolerance of previous                         anesthesia was also reviewed. The risks and benefits                         of the procedure and the sedation options and risks                         were discussed with the patient. All questions were                         answered, and informed consent was obtained. Prior                         Anticoagulants: The patient has taken no anticoagulant                         or antiplatelet agents. ASA Grade Assessment: II - A                         patient with mild systemic disease. After reviewing                         the risks and benefits, the patient was deemed in                         satisfactory condition to undergo the procedure.                        After obtaining informed consent, the colonoscope was                         passed under direct vision. Throughout the procedure,  the patient's blood pressure, pulse, and oxygen                         saturations were monitored continuously. The                         Colonoscope was introduced through the anus and                          advanced to the the cecum, identified by appendiceal                         orifice and ileocecal valve. The colonoscopy was                         performed without difficulty. The patient tolerated                         the procedure well. The quality of the bowel                         preparation was excellent. Findings:      The perianal and digital rectal examinations were normal.      Non-bleeding internal hemorrhoids were found during retroflexion. The       hemorrhoids were Grade I (internal hemorrhoids that do not prolapse). Impression:            - Non-bleeding internal hemorrhoids.                        - No specimens collected. Recommendation:        - Discharge patient to home.                        - Resume previous diet.                        - Continue present medications.                        - Repeat colonoscopy in 10 years for screening                         purposes. Procedure Code(s):     --- Professional ---                        [phone], Colonoscopy, flexible; diagnostic, including                         collection of specimen(s) by brushing or washing, when                         performed (separate procedure) Diagnosis Code(s):     --- Professional ---                        Z12.11, Encounter for screening for malignant neoplasm                         of colon CPT copyright 2022 American Medical Association. All rights reserved. The  codes documented in this report are preliminary and upon coder review may  be revised to meet current compliance requirements. Lucilla Lame MD, MD 06/04/2022 9:13:06 AM This report has been signed electronically. Number of Addenda: 0 Note Initiated On: 06/04/2022 8:48 AM Scope Withdrawal Time: 0 hours 7 minutes 27 seconds  Total Procedure Duration: 0 hours 14 minutes 7 seconds  Estimated Blood Loss:  Estimated blood loss: none.      [facility]

## 2022-06-04 NOTE — Anesthesia Postprocedure Evaluation (Signed)
Anesthesia Post Note  Patient: Robin Wise  Procedure(s) Performed: COLONOSCOPY WITH PROPOFOL  Patient location during evaluation: PACU Anesthesia Type: General Level of consciousness: awake and alert, oriented and patient cooperative Pain management: pain level controlled Vital Signs Assessment: post-procedure vital signs reviewed and stable Respiratory status: spontaneous breathing, nonlabored ventilation and respiratory function stable Cardiovascular status: blood pressure returned to baseline and stable Postop Assessment: adequate PO intake Anesthetic complications: no   No notable events documented.   Last Vitals:  Vitals:   06/04/22 0915 06/04/22 0925  BP: 99/64 102/69  Pulse: 80 71  Resp: 16 18  Temp: 36.6 C 36.6 C  SpO2: 94% 98%    Last Pain:  Vitals:   06/04/22 0925  PainSc: 0-No pain                 Darrin Nipper

## 2022-06-04 NOTE — Transfer of Care (Signed)
Immediate Anesthesia Transfer of Care Note  Patient: Robin Wise  Procedure(s) Performed: COLONOSCOPY WITH PROPOFOL  Patient Location: PACU  Anesthesia Type: General  Level of Consciousness: awake, alert  and patient cooperative  Airway and Oxygen Therapy: Patient Spontanous Breathing and Patient connected to supplemental oxygen  Post-op Assessment: Post-op Vital signs reviewed, Patient's Cardiovascular Status Stable, Respiratory Function Stable, Patent Airway and No signs of Nausea or vomiting  Post-op Vital Signs: Reviewed and stable  Complications: No notable events documented.

## 2022-06-05 ENCOUNTER — Encounter: Payer: Self-pay | Admitting: Gastroenterology

## 2022-10-08 ENCOUNTER — Other Ambulatory Visit: Payer: Self-pay | Admitting: Obstetrics & Gynecology

## 2022-10-08 DIAGNOSIS — Z1231 Encounter for screening mammogram for malignant neoplasm of breast: Secondary | ICD-10-CM

## 2022-10-22 ENCOUNTER — Ambulatory Visit
Admission: RE | Admit: 2022-10-22 | Discharge: 2022-10-22 | Disposition: A | Discharge status: Home / Self Care | Payer: BC Managed Care – PPO | Source: Ambulatory Visit | Attending: Obstetrics & Gynecology | Admitting: Obstetrics & Gynecology

## 2022-10-22 DIAGNOSIS — Z1231 Encounter for screening mammogram for malignant neoplasm of breast: Secondary | ICD-10-CM | POA: Diagnosis present

## 2023-01-22 DIAGNOSIS — H3552 Pigmentary retinal dystrophy: Secondary | ICD-10-CM

## 2023-01-22 HISTORY — DX: Pigmentary retinal dystrophy: H35.52

## 2023-04-03 IMAGING — MG MM DIGITAL SCREENING BILAT W/ TOMO AND CAD
8 series · 8 of 24 positions shown · non-contrast
Comparison: Previous exam(s).

CLINICAL DATA: Screening.

EXAM:
DIGITAL SCREENING BILATERAL MAMMOGRAM WITH TOMOSYNTHESIS AND CAD
TECHNIQUE: Bilateral screening digital craniocaudal and mediolateral oblique
mammograms were obtained. Bilateral screening digital breast
tomosynthesis was performed. The images were evaluated with
computer-aided detection.

[L MLO synth-2D]
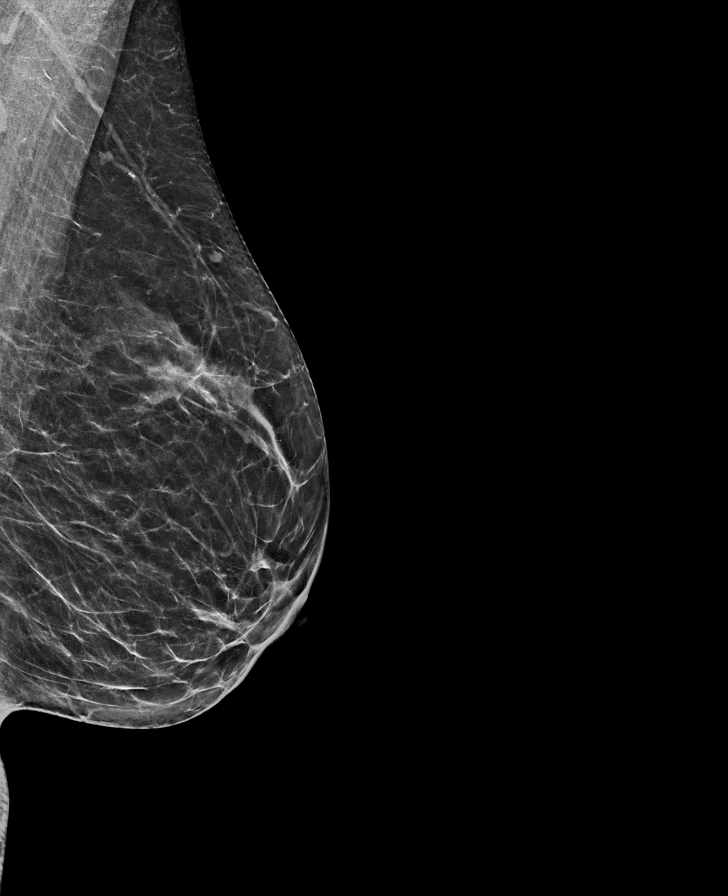

[L CC synth-2D]
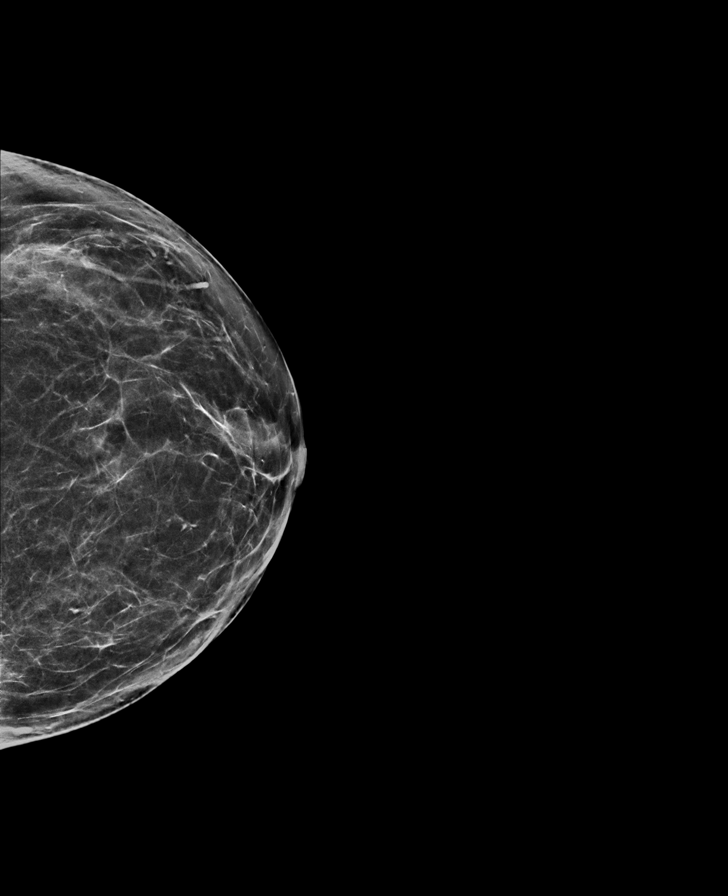

[R CC synth-2D]
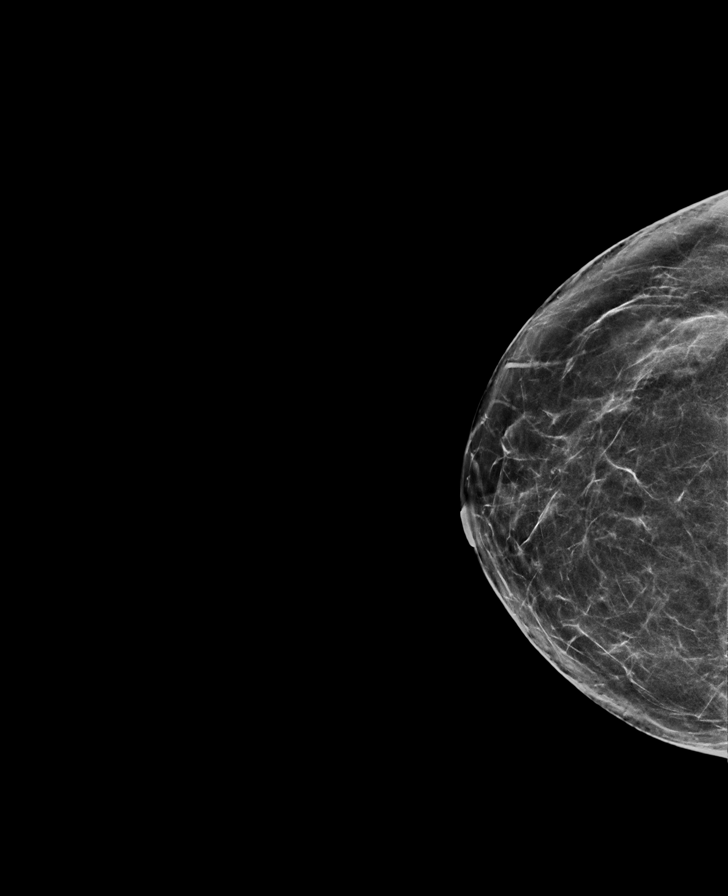

[R MLO synth-2D]
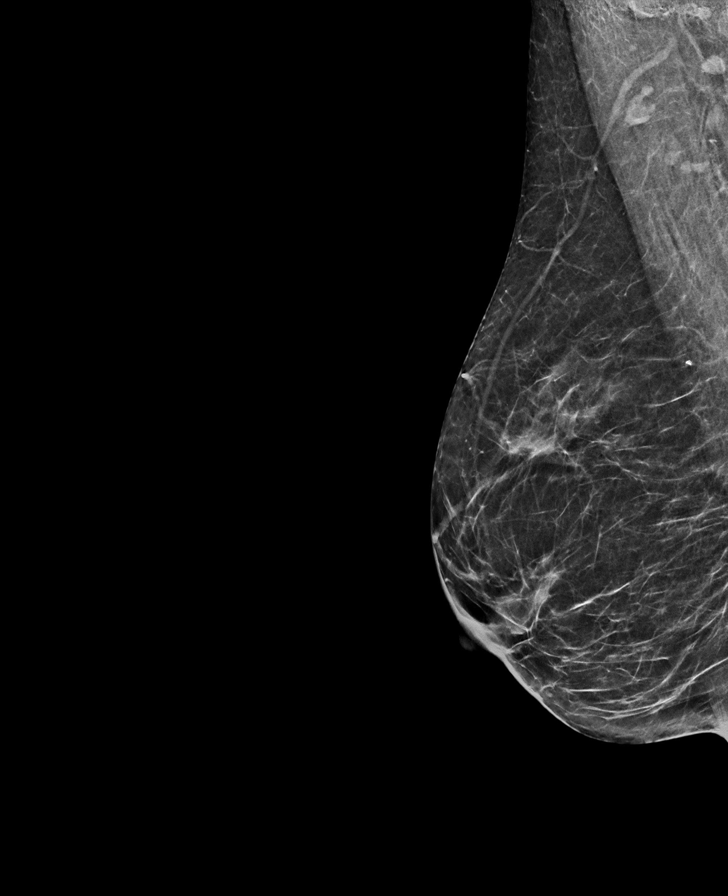

[L MLO tomo · tomo slice 31/61.0]
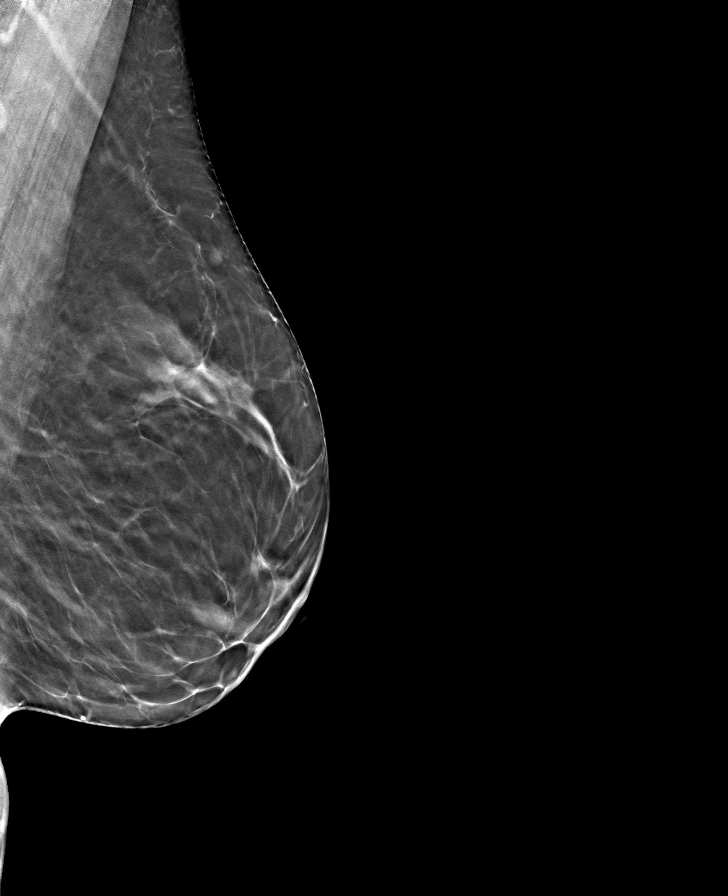

[L CC tomo · tomo slice 33/64.0]
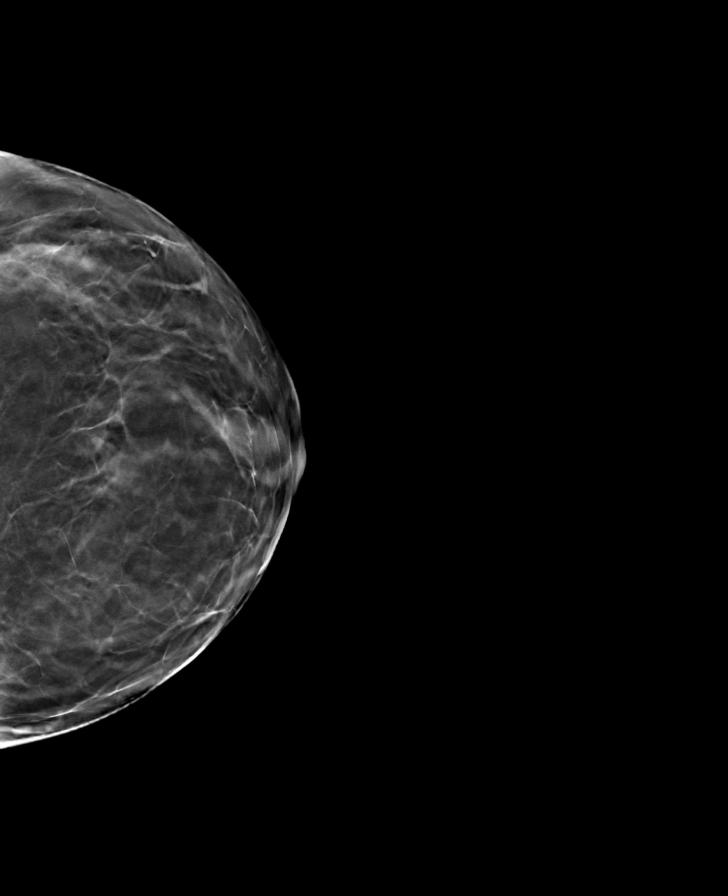

[R MLO tomo · tomo slice 29/56.0]
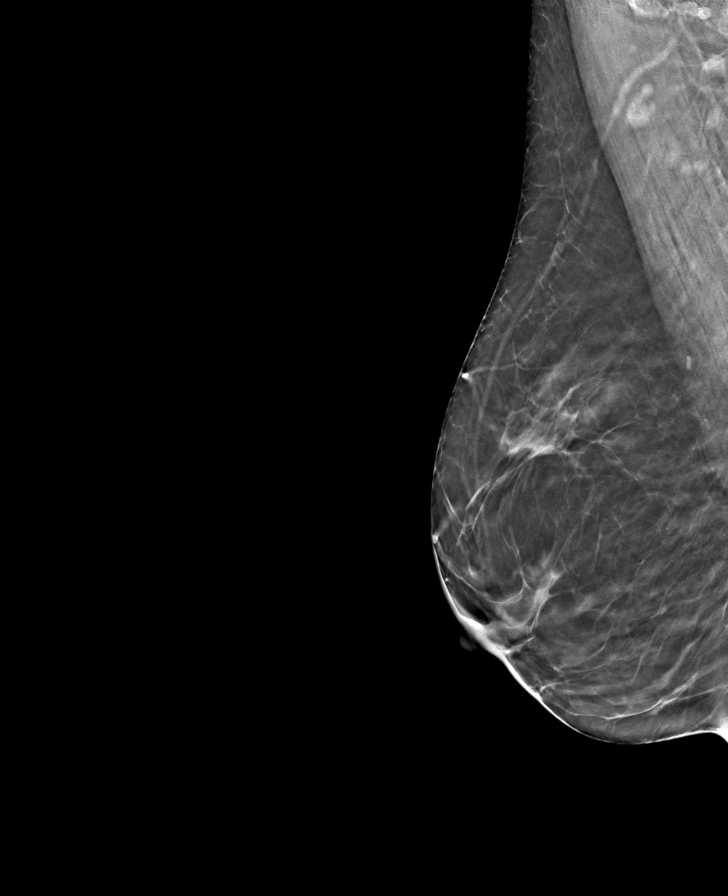

[R CC tomo · tomo slice 33/64.0]
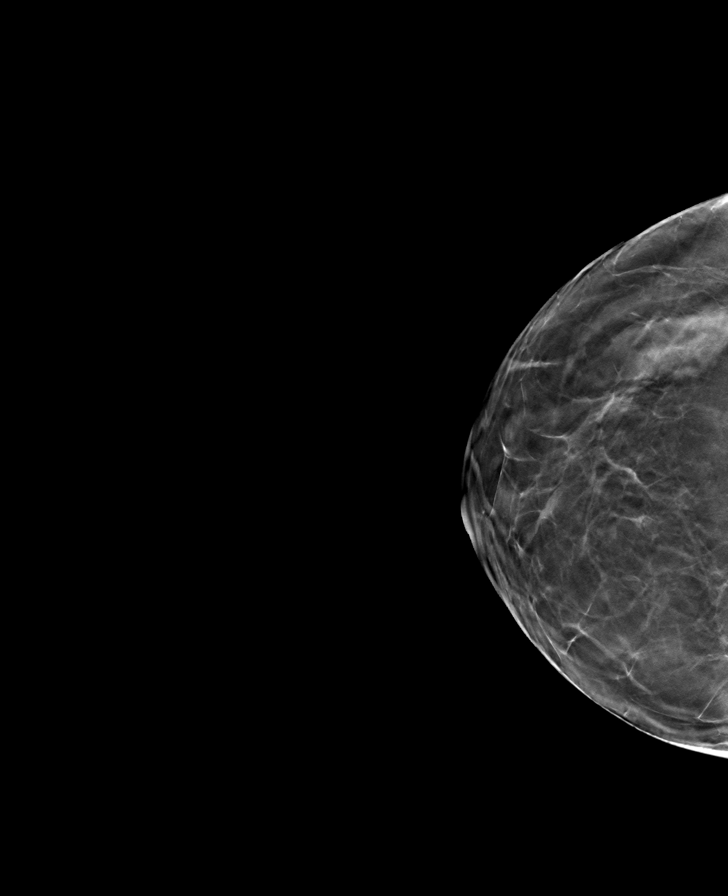

[8 of 24 positions shown; findings below may reference images not displayed]

ACR Breast Density Category b: There are scattered areas of
fibroglandular density.
FINDINGS: There are no findings suspicious for malignancy.
IMPRESSION: No mammographic evidence of malignancy. A result letter of this
screening mammogram will be mailed directly to the patient.

RECOMMENDATION:
Screening mammogram in one year. (Code:51-O-LD2)

BI-RADS CATEGORY  1: Negative.

## 2023-08-13 ENCOUNTER — Telehealth: Payer: Self-pay

## 2023-08-13 ENCOUNTER — Ambulatory Visit: Payer: Self-pay | Admitting: Obstetrics & Gynecology

## 2023-08-13 VITALS — BP 108/75 | HR 81 | Ht 63.0 in | Wt 140.8 lb

## 2023-08-13 DIAGNOSIS — Z0000 Encounter for general adult medical examination without abnormal findings: Secondary | ICD-10-CM

## 2023-08-13 DIAGNOSIS — Z01419 Encounter for gynecological examination (general) (routine) without abnormal findings: Secondary | ICD-10-CM | POA: Diagnosis not present

## 2023-08-13 MED ORDER — ESTRADIOL 10 MCG VA TABS: ORAL_TABLET | VAGINAL | 12 refills | Status: AC

## 2023-08-13 NOTE — Telephone Encounter (Signed)
 Called and left voicemail to see if she could come in earlier

## 2023-08-14 LAB — COMPREHENSIVE METABOLIC PANEL WITH GFR
ALT: 19 IU/L (ref 0–32)
AST: 29 IU/L (ref 0–40)
Albumin: 4.4 g/dL (ref 3.8–4.9)
Alkaline Phosphatase: 61 IU/L (ref 44–121)
BUN/Creatinine Ratio: 20 (ref 9–23)
BUN: 16 mg/dL (ref 6–24)
Bilirubin Total: 0.4 mg/dL (ref 0.0–1.2)
CO2: 27 mmol/L (ref 20–29)
Calcium: 9.7 mg/dL (ref 8.7–10.2)
Chloride: 100 mmol/L (ref 96–106)
Creatinine, Ser: 0.82 mg/dL (ref 0.57–1.00)
Globulin, Total: 2.8 g/dL (ref 1.5–4.5)
Glucose: 83 mg/dL (ref 70–99)
Potassium: 4.2 mmol/L (ref 3.5–5.2)
Sodium: 139 mmol/L (ref 134–144)
Total Protein: 7.2 g/dL (ref 6.0–8.5)
eGFR: 83 mL/min/{1.73_m2} (ref >59)

## 2023-08-14 LAB — LIPID PANEL
Chol/HDL Ratio: 5 ratio — ABNORMAL HIGH (ref 0.0–4.4)
Cholesterol, Total: 259 mg/dL — ABNORMAL HIGH (ref 100–199)
HDL: 52 mg/dL (ref >39)
LDL Chol Calc (NIH): 174 mg/dL — ABNORMAL HIGH (ref 0–99)
Triglycerides: 179 mg/dL — ABNORMAL HIGH (ref 0–149)
VLDL Cholesterol Cal: 33 mg/dL (ref 5–40)

## 2023-08-14 LAB — CBC
Hematocrit: 40.3 % (ref 34.0–46.6)
Hemoglobin: 13.1 g/dL (ref 11.1–15.9)
MCH: 29.4 pg (ref 26.6–33.0)
MCHC: 32.5 g/dL (ref 31.5–35.7)
MCV: 91 fL (ref 79–97)
Platelets: 242 10*3/uL (ref 150–450)
RBC: 4.45 x10E6/uL (ref 3.77–5.28)
RDW: 12.5 % (ref 11.7–15.4)
WBC: 6 10*3/uL (ref 3.4–10.8)

## 2023-08-14 LAB — TSH+FREE T4
Free T4: 1.05 ng/dL (ref 0.82–1.77)
TSH: 0.986 u[IU]/mL (ref 0.450–4.500)

## 2023-08-14 LAB — VITAMIN D 25 HYDROXY (VIT D DEFICIENCY, FRACTURES): Vit D, 25-Hydroxy: 33.5 ng/mL (ref 30.0–100.0)

## 2023-08-14 LAB — HEMOGLOBIN A1C
Est. average glucose Bld gHb Est-mCnc: 111 mg/dL
Hgb A1c MFr Bld: 5.5 % (ref 4.8–5.6)

## 2023-08-19 ENCOUNTER — Encounter: Payer: Self-pay | Admitting: Obstetrics & Gynecology

## 2023-08-19 NOTE — Progress Notes (Signed)
 ANNUAL PREVENTATIVE CARE GYNECOLOGY  ENCOUNTER NOTE  Subjective:       Robin Wise is a 58 y.o. married G2P2002  here for a routine annual gynecologic exam. The patient is sexually active. The patient is not taking hormone replacement therapy. Patient denies post-menopausal vaginal bleeding. The patient wears seatbelts: yes. The patient participates in regular exercise: no. Has the patient ever been transfused or tattooed?: no. The patient reports that there is not domestic violence in her life.     Gynecologic History Patient's last menstrual period was 04/08/2018 (exact date).  Last Pap: 04/2022. Results were: normal Last mammogram: 10/2022. Results were: normal Last Colonoscopy: 2024     Obstetric History OB History  Gravida Para Term Preterm AB Living  2 2 2   2   SAB IAB Ectopic Multiple Live Births      2    # Outcome Date GA Lbr Len/2nd Weight Sex Type Anes PTL Lv  2 Term 02/27/00    F Vag-Spont   LIV  1 Term 09/08/98    M Vag-Spont   LIV    Past Medical History:  Diagnosis Date   Autoimmune retinopathy (HCC)    Cervical dysplasia 2000   Mitral valve disorder    Plantar fasciitis    Presence of pessary    Vertigo    several times 2023. [place] maunuever fixed   Wears contact lenses     Family History  Problem Relation Age of Onset   Colon cancer Mother    Hypertension Father    Stroke Father    Arthritis/Rheumatoid Cousin    Breast cancer Neg Hx     Past Surgical History:  Procedure Laterality Date   COLONOSCOPY WITH PROPOFOL  N/A 06/04/2022   Procedure: COLONOSCOPY WITH PROPOFOL ;  Surgeon: Marnee Sink, MD;  Location: [facility];  Service: Endoscopy;  Laterality: N/A;   LEETZ  2001   TONSILLECTOMY AND ADENOIDECTOMY     TUBAL LIGATION  2005   Tubes in Ears      Social History   Socioeconomic History   Marital status: Married    Spouse name: Not on file   Number of children: 2   Years of education: Not on file   Highest education  level: Not on file  Occupational History   Not on file  Tobacco Use   Smoking status: Former    Types: Cigarettes   Smokeless tobacco: Never   Tobacco comments:    Tried cigs, age 58  Vaping Use   Vaping status: Never Used  Substance and Sexual Activity   Alcohol use: Not Currently    Comment: rare   Drug use: No   Sexual activity: Yes    Partners: Male    Birth control/protection: Surgical    Comment: BTL  Other Topics Concern   Not on file  Social History Narrative   Not on file   Social Drivers of Health   Financial Resource Strain: Not on file  Food Insecurity: Not on file  Transportation Needs: Not on file  Physical Activity: Not on file  Stress: Not on file  Social Connections: Not on file  Intimate Partner Violence: Not on file    Current Outpatient Medications on File Prior to Visit  Medication Sig Dispense Refill   Acetaminophen  (TYLENOL  PO) Take by mouth.     Acetylcysteine (NAC PO) Take by mouth.     Calcium Carbonate-Vitamin D  600-400 MG-UNIT tablet Take by mouth.     dorzolamide-timolol (COSOPT)  2-0.5 % ophthalmic solution Place 1 drop into the left eye 2 (two) times daily.     OVER THE COUNTER MEDICATION daily. [place]uguard with saffron for ocular support     Beta Carotene (VITAMIN A) 25000 UNIT capsule Take 10,000 Units by mouth daily. (Patient not taking: Reported on 08/13/2023)     triamcinolone acetonide (KENALOG-40) 40 MG/ML injection Inject 75 mg into the muscle once. (Patient not taking: Reported on 08/13/2023)     vitamin A 3 MG (10000 UNITS) capsule  (Patient not taking: Reported on 08/13/2023)     No current facility-administered medications on file prior to visit.    Allergies  Allergen Reactions   Amoxicillin Rash, Other (See Comments) and Hives    Calves got tight, joints got loose, and then got hives Loose joints   Ibuprofen Itching     possible allergy   Etodolac Hives   Sulfa Antibiotics Hives      Review of Systems ROS Review of  Systems - General ROS: negative for - chills, fatigue, fever, hot flashes, night sweats, weight gain or weight loss Psychological ROS: negative for - anxiety, decreased libido, depression, mood swings, physical abuse or sexual abuse Ophthalmic ROS: negative for - blurry vision, eye pain or loss of vision ENT ROS: negative for - headaches, hearing change, visual changes or vocal changes Allergy and Immunology ROS: negative for - hives, itchy/watery eyes or seasonal allergies Hematological and Lymphatic ROS: negative for - bleeding problems, bruising, swollen lymph nodes or weight loss Endocrine ROS: negative for - galactorrhea, hair pattern changes, hot flashes, malaise/lethargy, mood swings, palpitations, polydipsia/polyuria, skin changes, temperature intolerance or unexpected weight changes Breast ROS: negative for - new or changing breast lumps or nipple discharge Respiratory ROS: negative for - cough or shortness of breath Cardiovascular ROS: negative for - chest pain, irregular heartbeat, palpitations or shortness of breath Gastrointestinal ROS: no abdominal pain, change in bowel habits, or black or bloody stools Genito-Urinary ROS: no dysuria, trouble voiding, or hematuria Musculoskeletal ROS: negative for - joint pain or joint stiffness Neurological ROS: negative for - bowel and bladder control changes Dermatological ROS: negative for rash and skin lesion changes   Objective:   BP 108/75 (BP Location: Left Arm, Patient Position: Sitting, Cuff Size: Normal)   Pulse 81   Ht 5\' 3"  (1.6 m)   Wt 140 lb 12.8 oz (63.9 kg)   LMP 04/08/2018 (Exact Date)   BMI 24.94 kg/m  CONSTITUTIONAL: Well-developed, well-nourished female in no acute distress.  PSYCHIATRIC: Normal mood and affect. Normal behavior. Normal judgment and thought content. NEUROLGIC: Alert and oriented to person, place, and time. Normal muscle tone coordination. No cranial nerve deficit noted. HENT:  Normocephalic, atraumatic,  External right and left ear normal. Oropharynx is clear and moist EYES: Conjunctivae and EOM are normal. Pupils are equal, round, and reactive to light. No scleral icterus.  NECK: Normal range of motion, supple, no masses.  Normal thyroid.  SKIN: Skin is warm and dry. No rash noted. Not diaphoretic. No erythema. No pallor. CARDIOVASCULAR: Normal heart rate noted, regular rhythm, no murmur. RESPIRATORY: Clear to auscultation bilaterally. Effort and breath sounds normal, no problems with respiration noted. BREASTS: Symmetric in size. No masses, skin changes, nipple drainage, or lymphadenopathy. ABDOMEN: Soft, normal bowel sounds, no distention noted.  No tenderness, rebound or guarding.  BLADDER: Normal PELVIC:  Bladder no bladder distension noted  Urethra: normal appearing urethra with no masses, tenderness or lesions  Vulva: normal appearing vulva with no masses, tenderness  or lesions  Vagina: normal appearing vagina with normal color and discharge, no lesions  Cervix: normal appearing cervix without discharge or lesions  Uterus: uterus is normal size, shape, consistency and nontender  Adnexa: no masses    MUSCULOSKELETAL: Normal range of motion. No tenderness.  No cyanosis, clubbing, or edema.  2+ distal pulses. LYMPHATIC: No Axillary, Supraclavicular, or Inguinal Adenopathy.   Labs: Lab Results  Component Value Date   WBC 6.0 08/13/2023   HGB 13.1 08/13/2023   HCT 40.3 08/13/2023   MCV 91 08/13/2023   PLT 242 08/13/2023    Lab Results  Component Value Date   CREATININE 0.82 08/13/2023   BUN 16 08/13/2023   NA 139 08/13/2023   K 4.2 08/13/2023   CL 100 08/13/2023   CO2 27 08/13/2023    Lab Results  Component Value Date   ALT 19 08/13/2023   AST 29 08/13/2023   ALKPHOS 61 08/13/2023   BILITOT 0.4 08/13/2023    Lab Results  Component Value Date   CHOL 259 (H) 08/13/2023   HDL 52 08/13/2023   LDLCALC 174 (H) 08/13/2023   TRIG 179 (H) 08/13/2023   CHOLHDL 5.0 (H)  08/13/2023    Lab Results  Component Value Date   TSH 0.986 08/13/2023    Lab Results  Component Value Date   HGBA1C 5.5 08/13/2023     Assessment:   1. Well woman exam with routine gynecological exam   2. Encounter for preventive care      Plan:  Pap: not indicated Colon Screening:  up to date Labs:  routine fasting labs Routine preventative health maintenance measures emphasized: Exercise/Diet/Weight control  Return to Clinic - 1 Year   Ana Balling, MD [facility]

## 2023-11-19 ENCOUNTER — Ambulatory Visit: Admitting: Obstetrics & Gynecology

## 2023-11-19 ENCOUNTER — Encounter: Payer: Self-pay | Admitting: Obstetrics & Gynecology

## 2023-11-19 VITALS — BP 143/81 | HR 72 | Ht 63.0 in | Wt 137.0 lb

## 2023-11-19 DIAGNOSIS — N814 Uterovaginal prolapse, unspecified: Secondary | ICD-10-CM

## 2023-11-19 DIAGNOSIS — Z4689 Encounter for fitting and adjustment of other specified devices: Secondary | ICD-10-CM | POA: Diagnosis not present

## 2023-11-19 DIAGNOSIS — N812 Incomplete uterovaginal prolapse: Secondary | ICD-10-CM

## 2023-11-19 DIAGNOSIS — N816 Rectocele: Secondary | ICD-10-CM

## 2023-11-19 DIAGNOSIS — Z1231 Encounter for screening mammogram for malignant neoplasm of breast: Secondary | ICD-10-CM

## 2023-11-19 NOTE — Progress Notes (Signed)
    GYNECOLOGY PROGRESS NOTE  Subjective:    Patient ID: Robin Wise, female    DOB: April 24, 1965, 58 y.o.   MRN: [id number]  HPI  Patient is a 58 y.o. married  G2P2002 here for placement of a larger pessary. She started using a pessary about a year ago to treat her symptomatic uterine prolapse, cystocele and rectocele. She noted that her #3 ring was becoming dislodged and coming out/down.  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 She didn't use the vaginal estrogen presription from last year, worried about side effects. Also didn't feel like she needs it as she has sex only rarely.  The following portions of the patient's history were reviewed and updated as appropriate: allergies, current medications, past family history, past medical history, past social history, past surgical history, and problem list.  Review of Systems Pertinent items are noted in HPI.  Pap normal  Objective:   Blood pressure (!) 143/81, pulse 72, height 5' 3 (1.6 m), weight 137 lb (62.1 kg), last menstrual period 04/08/2018. Body mass index is 24.27 kg/m. Well nourished, well hydrated White female, no apparent distress She is ambulating and conversing normally. I placed a #4 ring pessary. This caused no discomfort (she reports that she cannot even feel it). It did not fall down/out with jumping.   Assessment:   1. Cystocele with incomplete uterovaginal prolapse   2. Rectocele   3. Encounter for screening mammogram for malignant neoplasm of breast      Plan:   1. Cystocele with incomplete uterovaginal prolapse (Primary) - #4 pessary placed  2. Rectocele   3. Encounter for screening mammogram for malignant neoplasm of breast  - MM 3D SCREENING MAMMOGRAM BILATERAL BREAST
